# Patient Record
Sex: Female | Born: 1940 | Race: White | Hispanic: No | Smoking: Former smoker
Health system: Southern US, Community
[De-identification: ages and names within clinical notes are randomized; demographics above are authoritative.]

## PROBLEM LIST (undated history)

## (undated) DIAGNOSIS — I10 Essential (primary) hypertension: Secondary | ICD-10-CM

## (undated) DIAGNOSIS — C801 Malignant (primary) neoplasm, unspecified: Secondary | ICD-10-CM

## (undated) HISTORY — PX: OTHER SURGICAL HISTORY: SHX169

## (undated) HISTORY — PX: APPENDECTOMY: SHX54

---

## 2004-02-04 DIAGNOSIS — C801 Malignant (primary) neoplasm, unspecified: Secondary | ICD-10-CM

## 2004-02-04 HISTORY — DX: Malignant (primary) neoplasm, unspecified: C80.1

## 2004-02-04 HISTORY — PX: BREAST LUMPECTOMY: SHX2

## 2005-02-03 HISTORY — PX: CHOLECYSTECTOMY: SHX55

## 2005-05-06 ENCOUNTER — Encounter: Admission: RE | Admit: 2005-05-06 | Discharge: 2005-05-06 | Payer: Self-pay | Admitting: General Surgery

## 2005-05-12 ENCOUNTER — Ambulatory Visit (HOSPITAL_COMMUNITY): Admission: RE | Admit: 2005-05-12 | Discharge: 2005-05-12 | Payer: Self-pay | Admitting: General Surgery

## 2005-12-16 ENCOUNTER — Ambulatory Visit: Admission: RE | Admit: 2005-12-16 | Discharge: 2006-03-16 | Payer: Self-pay | Admitting: Radiation Oncology

## 2010-02-24 ENCOUNTER — Encounter: Payer: Self-pay | Admitting: Hematology and Oncology

## 2010-02-24 ENCOUNTER — Encounter: Payer: Self-pay | Admitting: General Surgery

## 2011-09-23 ENCOUNTER — Encounter: Payer: Self-pay | Admitting: Hematology and Oncology

## 2011-09-23 DIAGNOSIS — C50919 Malignant neoplasm of unspecified site of unspecified female breast: Secondary | ICD-10-CM

## 2012-12-21 ENCOUNTER — Encounter (INDEPENDENT_AMBULATORY_CARE_PROVIDER_SITE_OTHER): Payer: Self-pay | Admitting: *Deleted

## 2012-12-24 ENCOUNTER — Telehealth (INDEPENDENT_AMBULATORY_CARE_PROVIDER_SITE_OTHER): Payer: Self-pay | Admitting: *Deleted

## 2012-12-24 ENCOUNTER — Encounter (INDEPENDENT_AMBULATORY_CARE_PROVIDER_SITE_OTHER): Payer: Self-pay | Admitting: *Deleted

## 2012-12-24 ENCOUNTER — Other Ambulatory Visit (INDEPENDENT_AMBULATORY_CARE_PROVIDER_SITE_OTHER): Payer: Self-pay | Admitting: *Deleted

## 2012-12-24 DIAGNOSIS — Z1211 Encounter for screening for malignant neoplasm of colon: Secondary | ICD-10-CM

## 2012-12-24 NOTE — Telephone Encounter (Signed)
Patient needs movi prep 

## 2012-12-27 MED ORDER — PEG-KCL-NACL-NASULF-NA ASC-C 100 G PO SOLR: 1.0000 | Freq: Once | ORAL | Status: DC

## 2013-03-01 ENCOUNTER — Other Ambulatory Visit: Payer: Self-pay | Admitting: Otolaryngology

## 2013-03-09 ENCOUNTER — Encounter (HOSPITAL_COMMUNITY): Admission: RE | Payer: Self-pay | Source: Ambulatory Visit

## 2013-03-09 ENCOUNTER — Ambulatory Visit (HOSPITAL_COMMUNITY): Admission: RE | Admit: 2013-03-09 | Payer: Medicare Other | Source: Ambulatory Visit | Admitting: Internal Medicine

## 2013-03-09 SURGERY — COLONOSCOPY
Anesthesia: Moderate Sedation

## 2014-12-20 ENCOUNTER — Encounter (INDEPENDENT_AMBULATORY_CARE_PROVIDER_SITE_OTHER): Payer: Self-pay | Admitting: *Deleted

## 2015-02-20 ENCOUNTER — Encounter (INDEPENDENT_AMBULATORY_CARE_PROVIDER_SITE_OTHER): Payer: Self-pay | Admitting: *Deleted

## 2015-02-20 ENCOUNTER — Other Ambulatory Visit (INDEPENDENT_AMBULATORY_CARE_PROVIDER_SITE_OTHER): Payer: Self-pay | Admitting: *Deleted

## 2015-02-20 DIAGNOSIS — Z1211 Encounter for screening for malignant neoplasm of colon: Secondary | ICD-10-CM

## 2015-03-22 DIAGNOSIS — Z8041 Family history of malignant neoplasm of ovary: Secondary | ICD-10-CM | POA: Diagnosis not present

## 2015-03-22 DIAGNOSIS — Z803 Family history of malignant neoplasm of breast: Secondary | ICD-10-CM | POA: Diagnosis not present

## 2015-03-22 DIAGNOSIS — Z01419 Encounter for gynecological examination (general) (routine) without abnormal findings: Secondary | ICD-10-CM | POA: Diagnosis not present

## 2015-03-22 DIAGNOSIS — Z853 Personal history of malignant neoplasm of breast: Secondary | ICD-10-CM | POA: Diagnosis not present

## 2015-03-22 DIAGNOSIS — Z6826 Body mass index (BMI) 26.0-26.9, adult: Secondary | ICD-10-CM | POA: Diagnosis not present

## 2015-03-22 DIAGNOSIS — Z801 Family history of malignant neoplasm of trachea, bronchus and lung: Secondary | ICD-10-CM | POA: Diagnosis not present

## 2015-03-27 DIAGNOSIS — H40023 Open angle with borderline findings, high risk, bilateral: Secondary | ICD-10-CM | POA: Diagnosis not present

## 2015-03-27 DIAGNOSIS — H04123 Dry eye syndrome of bilateral lacrimal glands: Secondary | ICD-10-CM | POA: Diagnosis not present

## 2015-03-27 DIAGNOSIS — H16423 Pannus (corneal), bilateral: Secondary | ICD-10-CM | POA: Diagnosis not present

## 2015-03-27 DIAGNOSIS — H01003 Unspecified blepharitis right eye, unspecified eyelid: Secondary | ICD-10-CM | POA: Diagnosis not present

## 2015-04-10 ENCOUNTER — Other Ambulatory Visit (INDEPENDENT_AMBULATORY_CARE_PROVIDER_SITE_OTHER): Payer: Self-pay | Admitting: *Deleted

## 2015-04-10 ENCOUNTER — Encounter (INDEPENDENT_AMBULATORY_CARE_PROVIDER_SITE_OTHER): Payer: Self-pay | Admitting: *Deleted

## 2015-04-10 DIAGNOSIS — Z1211 Encounter for screening for malignant neoplasm of colon: Secondary | ICD-10-CM

## 2015-04-10 MED ORDER — PEG-KCL-NACL-NASULF-NA ASC-C 100 G PO SOLR
1.0000 | Freq: Once | ORAL | Status: DC
Start: 2015-04-10 — End: 2015-05-23

## 2015-04-10 NOTE — Telephone Encounter (Signed)
Patient needs movi prep 

## 2015-04-24 DIAGNOSIS — J329 Chronic sinusitis, unspecified: Secondary | ICD-10-CM | POA: Diagnosis not present

## 2015-04-24 DIAGNOSIS — Z87891 Personal history of nicotine dependence: Secondary | ICD-10-CM | POA: Diagnosis not present

## 2015-04-24 DIAGNOSIS — R51 Headache: Secondary | ICD-10-CM | POA: Diagnosis not present

## 2015-04-24 DIAGNOSIS — R0982 Postnasal drip: Secondary | ICD-10-CM | POA: Diagnosis not present

## 2015-04-24 DIAGNOSIS — Z299 Encounter for prophylactic measures, unspecified: Secondary | ICD-10-CM | POA: Diagnosis not present

## 2015-04-24 DIAGNOSIS — R05 Cough: Secondary | ICD-10-CM | POA: Diagnosis not present

## 2015-05-02 ENCOUNTER — Telehealth (INDEPENDENT_AMBULATORY_CARE_PROVIDER_SITE_OTHER): Payer: Self-pay | Admitting: *Deleted

## 2015-05-02 NOTE — Telephone Encounter (Signed)
agree

## 2015-05-02 NOTE — Telephone Encounter (Signed)
Referring MD/PCP: vyas   Procedure: tcs  Reason/Indication:  screening  Has patient had this procedure before?  Yes, 10 yrs ago   If so, when, by whom and where?    Is there a family history of colon cancer?  no  Who?  What age when diagnosed?    Is patient diabetic?   no      Does patient have prosthetic heart valve or mechanical valve?  no  Do you have a pacemaker?  no  Has patient ever had endocarditis? no  Has patient had joint replacement within last 12 months?  nono  Does patient tend to be constipated or take laxatives? no  Does patient have a history of alcohol/drug use?  no  Is patient on Coumadin, Plavix and/or Aspirin? no  Medications: paxil 20 mg daily, lisinopril 10 mg daily   Allergies: nkda  Medication Adjustment:   Procedure date & time: 05/23/15 at 730

## 2015-05-23 ENCOUNTER — Encounter (HOSPITAL_COMMUNITY)
Admission: RE | Disposition: A | Discharge status: Home / Self Care | Payer: Self-pay | Source: Ambulatory Visit | Attending: Internal Medicine

## 2015-05-23 ENCOUNTER — Ambulatory Visit (HOSPITAL_COMMUNITY)
Admission: RE | Admit: 2015-05-23 | Discharge: 2015-05-23 | Disposition: A | Discharge status: Home / Self Care | Payer: Medicare Other | Source: Ambulatory Visit | Attending: Internal Medicine | Admitting: Internal Medicine

## 2015-05-23 ENCOUNTER — Encounter (HOSPITAL_COMMUNITY): Payer: Self-pay | Admitting: *Deleted

## 2015-05-23 DIAGNOSIS — I10 Essential (primary) hypertension: Secondary | ICD-10-CM | POA: Insufficient documentation

## 2015-05-23 DIAGNOSIS — Z1211 Encounter for screening for malignant neoplasm of colon: Secondary | ICD-10-CM | POA: Diagnosis not present

## 2015-05-23 DIAGNOSIS — K644 Residual hemorrhoidal skin tags: Secondary | ICD-10-CM | POA: Diagnosis not present

## 2015-05-23 DIAGNOSIS — Z853 Personal history of malignant neoplasm of breast: Secondary | ICD-10-CM | POA: Diagnosis not present

## 2015-05-23 DIAGNOSIS — Z79899 Other long term (current) drug therapy: Secondary | ICD-10-CM | POA: Diagnosis not present

## 2015-05-23 DIAGNOSIS — Z8049 Family history of malignant neoplasm of other genital organs: Secondary | ICD-10-CM | POA: Insufficient documentation

## 2015-05-23 DIAGNOSIS — K5730 Diverticulosis of large intestine without perforation or abscess without bleeding: Secondary | ICD-10-CM | POA: Diagnosis not present

## 2015-05-23 DIAGNOSIS — Z87891 Personal history of nicotine dependence: Secondary | ICD-10-CM | POA: Insufficient documentation

## 2015-05-23 DIAGNOSIS — D122 Benign neoplasm of ascending colon: Secondary | ICD-10-CM | POA: Insufficient documentation

## 2015-05-23 HISTORY — PX: COLONOSCOPY: SHX5424

## 2015-05-23 HISTORY — DX: Malignant (primary) neoplasm, unspecified: C80.1

## 2015-05-23 HISTORY — DX: Essential (primary) hypertension: I10

## 2015-05-23 SURGERY — COLONOSCOPY
Anesthesia: Moderate Sedation

## 2015-05-23 MED ORDER — MIDAZOLAM HCL 5 MG/5ML IJ SOLN
INTRAMUSCULAR | Status: AC
  Filled 2015-05-23: qty 10

## 2015-05-23 MED ORDER — MEPERIDINE HCL 50 MG/ML IJ SOLN
INTRAMUSCULAR | Status: AC
  Filled 2015-05-23: qty 1

## 2015-05-23 MED ORDER — MIDAZOLAM HCL 5 MG/5ML IJ SOLN
INTRAMUSCULAR | Status: DC | PRN
  Administered 2015-05-23: 1 mg via INTRAVENOUS
  Administered 2015-05-23 (×2): 2 mg via INTRAVENOUS

## 2015-05-23 MED ORDER — MEPERIDINE HCL 50 MG/ML IJ SOLN
INTRAMUSCULAR | Status: DC | PRN
  Administered 2015-05-23 (×2): 25 mg via INTRAVENOUS

## 2015-05-23 MED ORDER — SODIUM CHLORIDE 0.9 % IV SOLN
INTRAVENOUS | Status: DC
  Administered 2015-05-23: 1000 mL via INTRAVENOUS

## 2015-05-23 NOTE — Op Note (Signed)
[facility] Patient Name: Anna Silva Procedure Date: 05/23/2015 7:29 AM MRN: [id number] Date of Birth: 24-Dec-1940 Attending MD: Hildred Laser , MD CSN: [id number] Age: 75 Admit Type: Outpatient Procedure:                Colonoscopy Indications:              Screening for colorectal malignant neoplasm Providers:                Hildred Laser, MD, Lurline Del, RN, Georgeann Oppenheim,                            Technician Referring MD:             Glenda Chroman Medicines:                Meperidine 50 mg IV, Midazolam 5 mg IV Complications:            No immediate complications. Estimated Blood Loss:     Estimated blood loss was minimal. Procedure:                Pre-Anesthesia Assessment:                           - Prior to the procedure, a History and Physical                            was performed, and patient medications and                            allergies were reviewed. The patient's tolerance of                            previous anesthesia was also reviewed. The risks                            and benefits of the procedure and the sedation                            options and risks were discussed with the patient.                            All questions were answered, and informed consent                            was obtained. Prior Anticoagulants: The patient has                            taken no previous anticoagulant or antiplatelet                            agents. ASA Grade Assessment: I - A normal, healthy                            patient. After reviewing the risks and benefits,  the patient was deemed in satisfactory condition to                            undergo the procedure.                           After obtaining informed consent, the colonoscope                            was passed under direct vision. Throughout the                            procedure, the patient's blood pressure, pulse, and    oxygen saturations were monitored continuously. The                            [id number]) was introduced through the                            anus and advanced to the the cecum, identified by                            appendiceal orifice and ileocecal valve. The                            colonoscopy was performed without difficulty. The                            patient tolerated the procedure well. The quality                            of the bowel preparation was adequate. The                            ileocecal valve, appendiceal orifice, and rectum                            were photographed. Scope In: 7:43:22 AM Scope Out: 8:04:14 AM Scope Withdrawal Time: 0 hours 10 minutes 50 seconds  Total Procedure Duration: 0 hours 20 minutes 52 seconds  Findings:      A 6 mm polyp was found in the ascending colon. The polyp was sessile.       The polyp was removed with a cold snare. Resection and retrieval were       complete. For hemostasis, one hemostatic clip was successfully placed       (MR conditional). There was no bleeding at the end of the procedure.      A few small-mouthed diverticula were found in the sigmoid colon.      External hemorrhoids were found during retroflexion. The hemorrhoids       were small. Impression:               - One 6 mm polyp in the ascending colon, removed                            with  a cold snare. Resected and retrieved. Clip (MR                            conditional) was placed.                           - Diverticulosis in the sigmoid colon.                           - External hemorrhoids. Moderate Sedation:      Moderate (conscious) sedation was administered by the endoscopy nurse       and supervised by the endoscopist. The following parameters were       monitored: oxygen saturation, heart rate, blood pressure, CO2       capnography and response to care. Total physician intraservice time was       27 minutes. Recommendation:            - Patient has a contact number available for                            emergencies. The signs and symptoms of potential                            delayed complications were discussed with the                            patient. Return to normal activities tomorrow.                            Written discharge instructions were provided to the                            patient.                           - Resume previous diet and high fiber diet today.                           - Continue present medications.                           - No aspirin, ibuprofen, naproxen, or other                            non-steroidal anti-inflammatory drugs for 3 days                            after polyp removal.                           - Repeat colonoscopy date to be determined after                            pending pathology results are reviewed for  surveillance based on pathology results. Procedure Code(s):        --- Professional ---                           [phone], Colonoscopy, flexible; with removal of                            tumor(s), polyp(s), or other lesion(s) by snare                            technique                           99152, Moderate sedation services provided by the                            same physician or other qualified health care                            professional performing the diagnostic or                            therapeutic service that the sedation supports,                            requiring the presence of an independent trained                            observer to assist in the monitoring of the                            patient's level of consciousness and physiological                            status; initial 15 minutes of intraservice time,                            patient age 4 years or older                           [phone], Moderate sedation services; each additional                            15  minutes intraservice time Diagnosis Code(s):        --- Professional ---                           Z12.11, Encounter for screening for malignant                            neoplasm of colon                           D12.2, Benign neoplasm of ascending colon  K64.4, Residual hemorrhoidal skin tags                           K57.30, Diverticulosis of large intestine without                            perforation or abscess without bleeding CPT copyright 2016 American Medical Association. All rights reserved. The codes documented in this report are preliminary and upon coder review may  be revised to meet current compliance requirements. Hildred Laser, MD Hildred Laser, MD 05/23/2015 8:13:37 AM This report has been signed electronically. Number of Addenda: 0

## 2015-05-23 NOTE — H&P (Signed)
Anna Silva is an 75 y.o. female.   Chief Complaint: Patient is here for colonoscopy. HPI: 75 year old Caucasian female who is here for screening colonoscopy. She denies abdominal pain change in bowel habits or rectal bleeding. Last exam was 10 years ago in [facility] and was normal. Family history is negative for CRC. Mother died of uterine cancer at age 44. Patient states she had genetic testing and the test was normal.  Past Medical History  Diagnosis Date  . Hypertension   . 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 Breast lumpectomy  2006  . Cholecystectomy  2007  . Appendectomy    . C-sections      x3  . Caroid surgery Right     History reviewed. No pertinent family history. Social History:  reports that she has quit smoking. Her smoking use included Cigarettes. She has a 10 pack-year smoking history. She quit smokeless tobacco use about 30 years ago. She reports that she drinks about 7.2 oz of alcohol per week. She reports that she does not use illicit drugs.  Allergies: Not on File  Medications Prior to Admission  Medication Sig Dispense Refill  . lisinopril (PRINIVIL,ZESTRIL) 10 MG tablet Take 10 mg by mouth daily.    Marland Kitchen PARoxetine (PAXIL) 20 MG tablet Take 20 mg by mouth daily.    . peg 3350 powder (MOVIPREP) 100 g SOLR Take 1 kit (200 g total) by mouth once. 1 kit 0    No results found for this or any previous visit (from the past 48 hour(s)). No results found.  ROS  Blood pressure 187/83, pulse 53, temperature 97.6 F (36.4 C), temperature source Oral, resp. rate 9, height 5' 4"  (1.626 m), weight 150 lb (68.04 kg), SpO2 100 %. Physical Exam  Constitutional: She appears well-developed and well-nourished.  HENT:  Mouth/Throat: Oropharynx is clear and moist.  Eyes: Conjunctivae are normal.  Neck: No thyromegaly present.  Cardiovascular: Normal rate, regular rhythm and normal heart sounds.   No  murmur heard. Respiratory: Effort normal and breath sounds normal.  GI: Soft. She exhibits no distension and no mass. There is no tenderness.  Musculoskeletal: She exhibits no edema.  Lymphadenopathy:    She has no cervical adenopathy.  Neurological: She is alert.  Skin: Skin is warm and dry.     Assessment/Plan Average risk screening colonoscopy.  Rogene Houston, MD 05/23/2015, 7:30 AM

## 2015-05-23 NOTE — Discharge Instructions (Signed)
No aspirin or NSAIDs for 3 days. Resume usual medications and high fiber diet. No driving for 24 hours. Physician will call with biopsy results.   Colonoscopy, Care After These instructions give you information on caring for yourself after your procedure. Your doctor may also give you more specific instructions. Call your doctor if you have any problems or questions after your procedure. HOME CARE  Do not drive for 24 hours.  Do not sign important papers or use machinery for 24 hours.  You may shower.  You may go back to your usual activities, but go slower for the first 24 hours.  Take rest breaks often during the first 24 hours.  Walk around or use warm packs on your belly (abdomen) if you have belly cramping or gas.  Drink enough fluids to keep your pee (urine) clear or pale yellow.  Resume your normal diet. Avoid heavy or fried foods.  Avoid drinking alcohol for 24 hours or as told by your doctor.  Only take medicines as told by your doctor. If a tissue sample (biopsy) was taken during the procedure:   Do not take aspirin or blood thinners for 7 days, or as told by your doctor.  Do not drink alcohol for 7 days, or as told by your doctor.  Eat soft foods for the first 24 hours. GET HELP IF: You still have a small amount of blood in your poop (stool) 2-3 days after the procedure. GET HELP RIGHT AWAY IF:  You have more than a small amount of blood in your poop.  You see clumps of tissue (blood clots) in your poop.  Your belly is puffy (swollen).  You feel sick to your stomach (nauseous) or throw up (vomit).  You have a fever.  You have belly pain that gets worse and medicine does not help. MAKE SURE YOU:  Understand these instructions.  Will watch your condition.  Will get help right away if you are not doing well or get worse.   This information is not intended to replace advice given to you by your health care provider. Make sure you discuss any questions  you have with your health care provider.   Document Released: 02/22/2010 Document Revised: 01/25/2013 Document Reviewed: 09/27/2012 Elsevier Interactive Patient Education [employer].   Diverticulosis Diverticulosis is the condition that develops when small pouches (diverticula) form in the wall of your colon. Your colon, or large intestine, is where water is absorbed and stool is formed. The pouches form when the inside layer of your colon pushes through weak spots in the outer layers of your colon. CAUSES  No one knows exactly what causes diverticulosis. RISK FACTORS  Being older than 3. Your risk for this condition increases with age. Diverticulosis is rare in people younger than 40 years. By age 58, almost everyone has it.  Eating a low-fiber diet.  Being frequently constipated.  Being overweight.  Not getting enough exercise.  Smoking.  Taking over-the-counter pain medicines, like aspirin and ibuprofen. SYMPTOMS  Most people with diverticulosis do not have symptoms. DIAGNOSIS  Because diverticulosis often has no symptoms, health care providers often discover the condition during an exam for other colon problems. In many cases, a health care provider will diagnose diverticulosis while using a flexible scope to examine the colon (colonoscopy). TREATMENT  If you have never developed an infection related to diverticulosis, you may not need treatment. If you have had an infection before, treatment may include:  Eating more fruits, vegetables, and  grains.  Taking a fiber supplement.  Taking a live bacteria supplement (probiotic).  Taking medicine to relax your colon. HOME CARE INSTRUCTIONS   Drink at least 6-8 glasses of water each day to prevent constipation.  Try not to strain when you have a bowel movement.  Keep all follow-up appointments. If you have had an infection before:  Increase the fiber in your diet as directed by your health care provider or  dietitian.  Take a dietary fiber supplement if your health care provider approves.  Only take medicines as directed by your health care provider. SEEK MEDICAL CARE IF:   You have abdominal pain.  You have bloating.  You have cramps.  You have not gone to the bathroom in 3 days. SEEK IMMEDIATE MEDICAL CARE IF:   Your pain gets worse.  Yourbloating becomes very bad.  You have a fever or chills, and your symptoms suddenly get worse.  You begin vomiting.  You have bowel movements that are bloody or black. MAKE SURE YOU:  Understand these instructions.  Will watch your condition.  Will get help right away if you are not doing well or get worse.   This information is not intended to replace advice given to you by your health care provider. Make sure you discuss any questions you have with your health care provider.   Document Released: 10/18/2003 Document Revised: 01/25/2013 Document Reviewed: 12/15/2012 Elsevier Interactive Patient Education 2016 [employer].   Hemorrhoids Hemorrhoids are puffy (swollen) veins around the rectum or anus. Hemorrhoids can cause pain, itching, bleeding, or irritation. HOME CARE  Eat foods with fiber, such as whole grains, beans, nuts, fruits, and vegetables. Ask your doctor about taking products with added fiber in them (fibersupplements).  Drink enough fluid to keep your pee (urine) clear or pale yellow.  Exercise often.  Go to the bathroom when you have the urge to poop. Do not wait.  Avoid straining to poop (bowel movement).  Keep the butt area dry and clean. Use wet toilet paper or moist paper towels.  Medicated creams and medicine inserted into the anus (anal suppository) may be used or applied as told.  Only take medicine as told by your doctor.  Take a warm water bath (sitz bath) for 15-20 minutes to ease pain. Do this 3-4 times a day.  Place ice packs on the area if it is tender or puffy. Use the ice packs between the  warm water baths.  Put ice in a plastic bag.  Place a towel between your skin and the bag.  Leave the ice on for 15-20 minutes, 03-04 times a day.  Do not use a donut-shaped pillow or sit on the toilet for a long time. GET HELP RIGHT AWAY IF:   You have more pain that is not controlled by treatment or medicine.  You have bleeding that will not stop.  You have trouble or are unable to poop (bowel movement).  You have pain or puffiness outside the area of the hemorrhoids. MAKE SURE YOU:   Understand these instructions.  Will watch your condition.  Will get help right away if you are not doing well or get worse.   This information is not intended to replace advice given to you by your health care provider. Make sure you discuss any questions you have with your health care provider.   Document Released: 10/30/2007 Document Revised: 01/07/2012 Document Reviewed: 12/02/2011 Elsevier Interactive Patient Education 2016 Elsevier Inc.   Colon Polyps Polyps are lumps  of extra tissue growing inside the body. Polyps can grow in the large intestine (colon). Most colon polyps are noncancerous (benign). However, some colon polyps can become cancerous over time. Polyps that are larger than a pea may be harmful. To be safe, caregivers remove and test all polyps. CAUSES  Polyps form when mutations in the genes cause your cells to grow and divide even though no more tissue is needed. RISK FACTORS There are a number of risk factors that can increase your chances of getting colon polyps. They include:  Being older than 50 years.  Family history of colon polyps or colon cancer.  Long-term colon diseases, such as colitis or Crohn disease.  Being overweight.  Smoking.  Being inactive.  Drinking too much alcohol. SYMPTOMS  Most small polyps do not cause symptoms. If symptoms are present, they may include:  Blood in the stool. The stool may look dark red or black.  Constipation or  diarrhea that lasts longer than 1 week. DIAGNOSIS People often do not know they have polyps until their caregiver finds them during a regular checkup. Your caregiver can use 4 tests to check for polyps:  Digital rectal exam. The caregiver wears gloves and feels inside the rectum. This test would find polyps only in the rectum.  Barium enema. The caregiver puts a liquid called barium into your rectum before taking X-rays of your colon. Barium makes your colon look white. Polyps are dark, so they are easy to see in the X-ray pictures.  Sigmoidoscopy. A thin, flexible tube (sigmoidoscope) is placed into your rectum. The sigmoidoscope has a light and tiny camera in it. The caregiver uses the sigmoidoscope to look at the last third of your colon.  Colonoscopy. This test is like sigmoidoscopy, but the caregiver looks at the entire colon. This is the most common method for finding and removing polyps. TREATMENT  Any polyps will be removed during a sigmoidoscopy or colonoscopy. The polyps are then tested for cancer. PREVENTION  To help lower your risk of getting more colon polyps:  Eat plenty of fruits and vegetables. Avoid eating fatty foods.  Do not smoke.  Avoid drinking alcohol.  Exercise every day.  Lose weight if recommended by your caregiver.  Eat plenty of calcium and folate. Foods that are rich in calcium include milk, cheese, and broccoli. Foods that are rich in folate include chickpeas, kidney beans, and spinach. HOME CARE INSTRUCTIONS Keep all follow-up appointments as directed by your caregiver. You may need periodic exams to check for polyps. SEEK MEDICAL CARE IF: You notice bleeding during a bowel movement.   This information is not intended to replace advice given to you by your health care provider. Make sure you discuss any questions you have with your health care provider.   Document Released: 10/17/2003 Document Revised: 02/10/2014 Document Reviewed: 04/01/2011 Elsevier  Interactive Patient Education 2016 Elsevier Inc.  Moderate Conscious Sedation, Adult, Care After Refer to this sheet in the next few weeks. These instructions provide you with information on caring for yourself after your procedure. Your health care provider may also give you more specific instructions. Your treatment has been planned according to current medical practices, but problems sometimes occur. Call your health care provider if you have any problems or questions after your procedure. WHAT TO EXPECT AFTER THE PROCEDURE  After your procedure:  You may feel sleepy, clumsy, and have poor balance for several hours.  Vomiting may occur if you eat too soon after the procedure. HOME CARE  INSTRUCTIONS  Do not participate in any activities where you could become injured for at least 24 hours. Do not:  Drive.  Swim.  Ride a bicycle.  Operate heavy machinery.  Cook.  Use power tools.  Climb ladders.  Work from a high place.  Do not make important decisions or sign legal documents until you are improved.  If you vomit, drink water, juice, or soup when you can drink without vomiting. Make sure you have little or no nausea before eating solid foods.  Only take over-the-counter or prescription medicines for pain, discomfort, or fever as directed by your health care provider.  Make sure you and your family fully understand everything about the medicines given to you, including what side effects may occur.  You should not drink alcohol, take sleeping pills, or take medicines that cause drowsiness for at least 24 hours.  If you smoke, do not smoke without supervision.  If you are feeling better, you may resume normal activities 24 hours after you were sedated.  Keep all appointments with your health care provider. SEEK MEDICAL CARE IF:  Your skin is pale or bluish in color.  You continue to feel nauseous or vomit.  Your pain is getting worse and is not helped by  medicine.  You have bleeding or swelling.  You are still sleepy or feeling clumsy after 24 hours. SEEK IMMEDIATE MEDICAL CARE IF:  You develop a rash.  You have difficulty breathing.  You develop any type of allergic problem.  You have a fever. MAKE SURE YOU:  Understand these instructions.  Will watch your condition.  Will get help right away if you are not doing well or get worse.   This information is not intended to replace advice given to you by your health care provider. Make sure you discuss any questions you have with your health care provider.   Document Released: 11/10/2012 Document Revised: 02/10/2014 Document Reviewed: 11/10/2012 Elsevier Interactive Patient Education [employer].

## 2015-05-28 ENCOUNTER — Encounter (HOSPITAL_COMMUNITY): Payer: Self-pay | Admitting: Internal Medicine

## 2015-06-18 DIAGNOSIS — I10 Essential (primary) hypertension: Secondary | ICD-10-CM | POA: Diagnosis not present

## 2015-06-18 DIAGNOSIS — Z299 Encounter for prophylactic measures, unspecified: Secondary | ICD-10-CM | POA: Diagnosis not present

## 2015-06-27 DIAGNOSIS — H40023 Open angle with borderline findings, high risk, bilateral: Secondary | ICD-10-CM | POA: Diagnosis not present

## 2015-06-27 DIAGNOSIS — H01003 Unspecified blepharitis right eye, unspecified eyelid: Secondary | ICD-10-CM | POA: Diagnosis not present

## 2015-11-02 DIAGNOSIS — I10 Essential (primary) hypertension: Secondary | ICD-10-CM | POA: Diagnosis not present

## 2015-11-02 DIAGNOSIS — J069 Acute upper respiratory infection, unspecified: Secondary | ICD-10-CM | POA: Diagnosis not present

## 2015-11-09 DIAGNOSIS — Z713 Dietary counseling and surveillance: Secondary | ICD-10-CM | POA: Diagnosis not present

## 2015-11-09 DIAGNOSIS — J329 Chronic sinusitis, unspecified: Secondary | ICD-10-CM | POA: Diagnosis not present

## 2015-11-09 DIAGNOSIS — I10 Essential (primary) hypertension: Secondary | ICD-10-CM | POA: Diagnosis not present

## 2015-11-09 DIAGNOSIS — Z299 Encounter for prophylactic measures, unspecified: Secondary | ICD-10-CM | POA: Diagnosis not present

## 2015-11-09 DIAGNOSIS — Z6824 Body mass index (BMI) 24.0-24.9, adult: Secondary | ICD-10-CM | POA: Diagnosis not present

## 2015-11-10 ENCOUNTER — Emergency Department (HOSPITAL_COMMUNITY)
Admission: EM | Admit: 2015-11-10 | Discharge: 2015-11-11 | Disposition: A | Discharge status: Home / Self Care | Payer: Medicare Other | Attending: Emergency Medicine | Admitting: Emergency Medicine

## 2015-11-10 ENCOUNTER — Encounter (HOSPITAL_COMMUNITY): Payer: Self-pay | Admitting: Emergency Medicine

## 2015-11-10 DIAGNOSIS — R030 Elevated blood-pressure reading, without diagnosis of hypertension: Secondary | ICD-10-CM | POA: Diagnosis not present

## 2015-11-10 DIAGNOSIS — T380X5A Adverse effect of glucocorticoids and synthetic analogues, initial encounter: Secondary | ICD-10-CM | POA: Diagnosis not present

## 2015-11-10 DIAGNOSIS — T50905A Adverse effect of unspecified drugs, medicaments and biological substances, initial encounter: Secondary | ICD-10-CM

## 2015-11-10 DIAGNOSIS — I10 Essential (primary) hypertension: Secondary | ICD-10-CM | POA: Diagnosis not present

## 2015-11-10 DIAGNOSIS — R079 Chest pain, unspecified: Secondary | ICD-10-CM | POA: Diagnosis not present

## 2015-11-10 DIAGNOSIS — R072 Precordial pain: Secondary | ICD-10-CM | POA: Insufficient documentation

## 2015-11-10 DIAGNOSIS — Z87891 Personal history of nicotine dependence: Secondary | ICD-10-CM | POA: Diagnosis not present

## 2015-11-10 DIAGNOSIS — Z853 Personal history of malignant neoplasm of breast: Secondary | ICD-10-CM | POA: Diagnosis not present

## 2015-11-10 NOTE — ED Triage Notes (Signed)
Per EMS:  Pt has recent ear/sinus infection which "spiked again" today.  Pt was seen at PCP and started on prednisone with a steroid injection as well.  Pt began to experience right sided CP with radiation to her back.  Denies any other associated symptoms.  EMS sts EKG was NSR, but beats began to widen and pt had several  "dropped beats".  Pt axo, NAD, pain has resolved.

## 2015-11-11 ENCOUNTER — Emergency Department (HOSPITAL_COMMUNITY): Payer: Medicare Other

## 2015-11-11 ENCOUNTER — Encounter (HOSPITAL_COMMUNITY): Payer: Self-pay | Admitting: Emergency Medicine

## 2015-11-11 DIAGNOSIS — R072 Precordial pain: Secondary | ICD-10-CM | POA: Diagnosis not present

## 2015-11-11 DIAGNOSIS — R079 Chest pain, unspecified: Secondary | ICD-10-CM | POA: Diagnosis not present

## 2015-11-11 LAB — CBC WITH DIFFERENTIAL/PLATELET
BASOS PCT: 0 %
Basophils Absolute: 0 10*3/uL (ref 0.0–0.1)
EOS ABS: 0 10*3/uL (ref 0.0–0.7)
Eosinophils Relative: 0 %
HCT: 56.4 % — ABNORMAL HIGH (ref 36.0–46.0)
HEMOGLOBIN: 19 g/dL — AB (ref 12.0–15.0)
LYMPHS ABS: 1.1 10*3/uL (ref 0.7–4.0)
Lymphocytes Relative: 12 %
MCH: 30.9 pg (ref 26.0–34.0)
MCHC: 33.7 g/dL (ref 30.0–36.0)
MCV: 91.9 fL (ref 78.0–100.0)
MONO ABS: 0.4 10*3/uL (ref 0.1–1.0)
MONOS PCT: 5 %
NEUTROS PCT: 83 %
Neutro Abs: 7.3 10*3/uL (ref 1.7–7.7)
Platelets: 237 10*3/uL (ref 150–400)
RBC: 6.14 MIL/uL — ABNORMAL HIGH (ref 3.87–5.11)
RDW: 13.9 % (ref 11.5–15.5)
WBC: 8.9 10*3/uL (ref 4.0–10.5)

## 2015-11-11 LAB — I-STAT CHEM 8, ED
BUN: 20 mg/dL (ref 6–20)
CALCIUM ION: 0.97 mmol/L — AB (ref 1.15–1.40)
CREATININE: 0.6 mg/dL (ref 0.44–1.00)
Chloride: 106 mmol/L (ref 101–111)
Glucose, Bld: 115 mg/dL — ABNORMAL HIGH (ref 65–99)
HEMATOCRIT: 57 % — AB (ref 36.0–46.0)
HEMOGLOBIN: 19.4 g/dL — AB (ref 12.0–15.0)
Potassium: 4.3 mmol/L (ref 3.5–5.1)
SODIUM: 140 mmol/L (ref 135–145)
TCO2: 21 mmol/L (ref 0–100)

## 2015-11-11 LAB — I-STAT TROPONIN, ED
Troponin i, poc: 0 ng/mL (ref 0.00–0.08)
Troponin i, poc: 0 ng/mL (ref 0.00–0.08)

## 2015-11-11 MED ORDER — GI COCKTAIL ~~LOC~~
30.0000 mL | Freq: Once | ORAL | Status: AC
  Administered 2015-11-11: 30 mL via ORAL
  Filled 2015-11-11: qty 30

## 2015-11-11 NOTE — ED Notes (Signed)
Pt transported to xray 

## 2015-11-11 NOTE — ED Provider Notes (Signed)
[facility] DEPT Provider Note   CSN: [id number] Arrival date & time: 11/10/15  2340  By signing my name below, I, Hansel Feinstein, attest that this documentation has been prepared under the direction and in the presence of Clancy Leiner, MD. Electronically Signed: Hansel Feinstein, ED Scribe. 11/11/15. 12:21 AM.     History   Chief Complaint Chief Complaint  Patient presents with  . Chest Pain    HPI Anna Silva is a 75 y.o. female with h/o HTN who presents to the Emergency Department complaining of a moderate episode of CP that occurred last night, and lasted approximately one hour. Pt states that she had a glass of wine tonight and subsequently began to feel substernal CP that radiated to her back, which lasted approximately an hour. She also notes that she had ear pain during this episode of pain. She states her symptoms felt similar to indigestion. She denies any pain currently. Pt states she has been taking amoxicillin and IM prednisone for the last week for a congestion and URI. She also reports she has been taking Mucinex for her URI symptoms. Pt states her URI symptoms have not worsened this evening. She denies SOB.   The history is provided by the patient. No language interpreter was used.  Chest Pain   This is a new problem. The current episode started 1 to 2 hours ago. The problem occurs constantly. The problem has been resolved. Associated with: alcohol consumption  The pain is present in the substernal region. The pain is moderate. The quality of the pain is described as brief. The pain radiates to the upper back. Duration of episode(s) is 1 hour. Pertinent negatives include no shortness of breath. Risk factors include alcohol intake.  Her past medical history is significant for hypertension.    Past Medical History:  Diagnosis Date  . Cancer [facility]) 2006   Breat Cancer Right  . Hypertension     There are no active problems to display for this patient.   Past Surgical  History:  Procedure Laterality Date  . APPENDECTOMY    . BREAST LUMPECTOMY  2006  . C-sections     x3  . Caroid Surgery Right   . CHOLECYSTECTOMY  2007  . COLONOSCOPY N/A 05/23/2015   Procedure: COLONOSCOPY;  Surgeon: Rogene Houston, MD;  Location: [facility]ITE;  Service: Endoscopy;  Laterality: N/A;  730    OB History    No data available       Home Medications    Prior to Admission medications   Medication Sig Start Date End Date Taking? Authorizing Provider  lisinopril (PRINIVIL,ZESTRIL) 10 MG tablet Take 10 mg by mouth daily.    Historical Provider, MD  PARoxetine (PAXIL) 20 MG tablet Take 20 mg by mouth daily.    Historical Provider, MD    Family History Family History  Problem Relation Age of Onset  . Cancer - Other Mother     Social History Social History  Substance Use Topics  . Smoking status: Former Smoker    Packs/day: 0.50    Years: 20.00    Types: Cigarettes  . Smokeless tobacco: Former Systems developer    Quit date: 02/02/1985  . Alcohol use 7.2 oz/week    12 Glasses of wine per week     Comment: Couple of glasses of wine nightly     Allergies   Review of patient's allergies indicates no known allergies.   Review of Systems Review of Systems  HENT: Positive for  congestion and ear pain.   Respiratory: Negative for shortness of breath.   Cardiovascular: Positive for chest pain (resolved).  All other systems reviewed and are negative.    Physical Exam Updated Vital Signs BP 150/99 (BP Location: Left Arm)   Pulse 94   Temp 98.1 F (36.7 C) (Oral)   Resp 20   Ht 5\' 4"  (1.626 m)   Wt 147 lb (66.7 kg)   SpO2 92%   BMI 25.23 kg/m   Physical Exam  Constitutional: She is oriented to person, place, and time. She appears well-developed and well-nourished.  HENT:  Head: Normocephalic and atraumatic.  Mouth/Throat: Oropharynx is clear and moist. No oropharyngeal exudate.  Eyes: Conjunctivae and EOM are normal. Pupils are equal, round, and reactive to  light.  Moist mucous membranes.   Neck: Normal range of motion. Neck supple. No JVD present. No tracheal deviation present.  No carotid bruits. Trachea midline.   Cardiovascular: Normal rate, regular rhythm and normal heart sounds.  Exam reveals no gallop and no friction rub.   No murmur heard. RRR.   Pulmonary/Chest: Effort normal and breath sounds normal. No stridor. No respiratory distress. She has no wheezes. She has no rales.  Lungs CTA bilaterally.   Abdominal: Soft. She exhibits no distension. There is no rebound and no guarding.  Bowel sounds halfway up into the thoracic cavity. Hyperactive bowel sounds throughout the abdominal cavity.   Musculoskeletal: Normal range of motion.  Intact PTs. Intact DPs. No edema. All compartments soft.   Lymphadenopathy:    She has no cervical adenopathy.  Neurological: She is alert and oriented to person, place, and time. She has normal reflexes.  Skin: Skin is warm and dry.  Psychiatric: She has a normal mood and affect.  Nursing note and vitals reviewed.   ED Treatments / Results   DIAGNOSTIC STUDIES: Oxygen Saturation is 92% on RA, low by my interpretation.    COORDINATION OF CARE: 12:18 AM Discussed treatment plan with pt at bedside which includes lab work, CXR and pt agreed to plan.    Labs (all labs ordered are listed, but only abnormal results are displayed) Labs Reviewed  I-STAT CHEM 8, ED - Abnormal; Notable for the following:       Result Value   Glucose, Bld 115 (*)    Calcium, Ion 0.97 (*)    Hemoglobin 19.4 (*)    HCT 57.0 (*)    All other components within normal limits  CBC WITH DIFFERENTIAL/PLATELET  I-STAT TROPOININ, ED    EKG  EKG Interpretation  Date/Time:  Saturday November 10 2015 23:46:46 EDT Ventricular Rate:  103 PR Interval:    QRS Duration: 88 QT Interval:  343 QTC Calculation: 449 R Axis:   4 Text Interpretation:  Sinus tachycardia RSR' in V1 or V2, probably normal variant Confirmed by  [facility]  MD, Kaine Mcquillen ([id number]) on 11/11/2015 12:04:54 AM       Radiology Dg Chest 2 View  Result Date: 11/11/2015 CLINICAL DATA:  Recent ear and sinus infection which spike began today. Seen at primary care physician today and started on prednisone with a steroid injection. Now has begun to experience right-sided chest pain with radiation to the back. History of hypertension and right breast cancer. EXAM: CHEST  2 VIEW COMPARISON:  None. FINDINGS: Normal heart size and pulmonary vascularity. Mild central interstitial changes may indicate chronic bronchitis. No focal airspace disease or consolidation. No blunting of costophrenic angles. No pneumothorax. Mediastinal contours appear intact. Surgical clips in  the right breast and right upper quadrant. Mild thoracic scoliosis convex towards the right. IMPRESSION: Probable chronic bronchitic changes in the lungs. No evidence of active pulmonary disease. Electronically Signed   By: Lucienne Capers M.D.   On: 11/11/2015 00:14    Procedures Procedures (including critical care time)  Medications Ordered in ED Medications - No data to display   Initial Impression / Assessment and Plan / ED Course  I have reviewed the triage vital signs and the nursing notes.  Pertinent labs & imaging results that were available during my care of the patient were reviewed by me and considered in my medical decision making (see chart for details).  Clinical Course    EKG Interpretation  Date/Time:  Saturday November 10 2015 23:46:46 EDT Ventricular Rate:  103 PR Interval:    QRS Duration: 88 QT Interval:  343 QTC Calculation: 449 R Axis:   4 Text Interpretation:  Sinus tachycardia RSR' in V1 or V2, probably normal variant Confirmed by [facility]  MD, Coleen Cardiff ([id number]) on 11/11/2015 12:04:54 AM      Results for orders placed or performed during the hospital encounter of 11/10/15  CBC with Differential/Platelet  Result Value Ref Range   WBC 8.9 4.0 - 10.5  K/uL   RBC 6.14 (H) 3.87 - 5.11 MIL/uL   Hemoglobin 19.0 (H) 12.0 - 15.0 g/dL   HCT 56.4 (H) 36.0 - 46.0 %   MCV 91.9 78.0 - 100.0 fL   MCH 30.9 26.0 - 34.0 pg   MCHC 33.7 30.0 - 36.0 g/dL   RDW 13.9 11.5 - 15.5 %   Platelets 237 150 - 400 K/uL   Neutrophils Relative % 83 %   Neutro Abs 7.3 1.7 - 7.7 K/uL   Lymphocytes Relative 12 %   Lymphs Abs 1.1 0.7 - 4.0 K/uL   Monocytes Relative 5 %   Monocytes Absolute 0.4 0.1 - 1.0 K/uL   Eosinophils Relative 0 %   Eosinophils Absolute 0.0 0.0 - 0.7 K/uL   Basophils Relative 0 %   Basophils Absolute 0.0 0.0 - 0.1 K/uL  I-Stat Chem 8, ED  Result Value Ref Range   Sodium 140 135 - 145 mmol/L   Potassium 4.3 3.5 - 5.1 mmol/L   Chloride 106 101 - 111 mmol/L   BUN 20 6 - 20 mg/dL   Creatinine, Ser 0.60 0.44 - 1.00 mg/dL   Glucose, Bld 115 (H) 65 - 99 mg/dL   Calcium, Ion 0.97 (L) 1.15 - 1.40 mmol/L   TCO2 21 0 - 100 mmol/L   Hemoglobin 19.4 (H) 12.0 - 15.0 g/dL   HCT 57.0 (H) 36.0 - 46.0 %  I-stat troponin, ED  Result Value Ref Range   Troponin i, poc 0.00 0.00 - 0.08 ng/mL   Comment 3          I-stat troponin, ED  Result Value Ref Range   Troponin i, poc 0.00 0.00 - 0.08 ng/mL   Comment 3           Dg Chest 2 View  Result Date: 11/11/2015 CLINICAL DATA:  Recent ear and sinus infection which spike began today. Seen at primary care physician today and started on prednisone with a steroid injection. Now has begun to experience right-sided chest pain with radiation to the back. History of hypertension and right breast cancer. EXAM: CHEST  2 VIEW COMPARISON:  None. FINDINGS: Normal heart size and pulmonary vascularity. Mild central interstitial changes may indicate chronic bronchitis. No focal airspace disease  or consolidation. No blunting of costophrenic angles. No pneumothorax. Mediastinal contours appear intact. Surgical clips in the right breast and right upper quadrant. Mild thoracic scoliosis convex towards the right. IMPRESSION:  Probable chronic bronchitic changes in the lungs. No evidence of active pulmonary disease. Electronically Signed   By: Lucienne Capers M.D.   On: 11/11/2015 00:14   Medications  gi cocktail (Maalox,Lidocaine,Donnatal) (30 mLs Oral Given 11/11/15 0022)     Final Clinical Impressions(s) / ED Diagnoses   New Prescriptions New Prescriptions   No medications on file  HEART score 1, low risk for MACE.  Symptoms highly atypical for cardiac chest pain.  Highly doubt PE in this very low risk patient.  Symptoms consistent with adverse drug reaction.  No more alcohol while taking medication.  All questions answered to patient's satisfaction. Based on history and exam patient has been appropriately medically screened and emergency conditions excluded. Patient is stable for discharge at this time. Follow up with your PMD for recheck in 2 days and strict return precautions given.   I personally performed the services described in this documentation, which was scribed in my presence. The recorded information has been reviewed and is accurate.      Veatrice Kells, MD 11/11/15 [phone]

## 2015-11-13 DIAGNOSIS — N390 Urinary tract infection, site not specified: Secondary | ICD-10-CM | POA: Diagnosis not present

## 2015-11-13 DIAGNOSIS — R350 Frequency of micturition: Secondary | ICD-10-CM | POA: Diagnosis not present

## 2015-11-13 DIAGNOSIS — Z6824 Body mass index (BMI) 24.0-24.9, adult: Secondary | ICD-10-CM | POA: Diagnosis not present

## 2015-11-13 DIAGNOSIS — Z713 Dietary counseling and surveillance: Secondary | ICD-10-CM | POA: Diagnosis not present

## 2015-11-21 DIAGNOSIS — Z23 Encounter for immunization: Secondary | ICD-10-CM | POA: Diagnosis not present

## 2015-12-28 DIAGNOSIS — Z7189 Other specified counseling: Secondary | ICD-10-CM | POA: Diagnosis not present

## 2015-12-28 DIAGNOSIS — Z79899 Other long term (current) drug therapy: Secondary | ICD-10-CM | POA: Diagnosis not present

## 2015-12-28 DIAGNOSIS — Z299 Encounter for prophylactic measures, unspecified: Secondary | ICD-10-CM | POA: Diagnosis not present

## 2015-12-28 DIAGNOSIS — Z6825 Body mass index (BMI) 25.0-25.9, adult: Secondary | ICD-10-CM | POA: Diagnosis not present

## 2015-12-28 DIAGNOSIS — Z1389 Encounter for screening for other disorder: Secondary | ICD-10-CM | POA: Diagnosis not present

## 2015-12-28 DIAGNOSIS — E7800 Pure hypercholesterolemia, unspecified: Secondary | ICD-10-CM | POA: Diagnosis not present

## 2015-12-28 DIAGNOSIS — Z0000 Encounter for general adult medical examination without abnormal findings: Secondary | ICD-10-CM | POA: Diagnosis not present

## 2015-12-28 DIAGNOSIS — R5383 Other fatigue: Secondary | ICD-10-CM | POA: Diagnosis not present

## 2015-12-28 DIAGNOSIS — Z1211 Encounter for screening for malignant neoplasm of colon: Secondary | ICD-10-CM | POA: Diagnosis not present

## 2016-01-07 DIAGNOSIS — Z9889 Other specified postprocedural states: Secondary | ICD-10-CM | POA: Diagnosis not present

## 2016-01-07 DIAGNOSIS — Z1231 Encounter for screening mammogram for malignant neoplasm of breast: Secondary | ICD-10-CM | POA: Diagnosis not present

## 2016-01-15 DIAGNOSIS — Z299 Encounter for prophylactic measures, unspecified: Secondary | ICD-10-CM | POA: Diagnosis not present

## 2016-01-15 DIAGNOSIS — I10 Essential (primary) hypertension: Secondary | ICD-10-CM | POA: Diagnosis not present

## 2016-01-15 DIAGNOSIS — Z6824 Body mass index (BMI) 24.0-24.9, adult: Secondary | ICD-10-CM | POA: Diagnosis not present

## 2016-01-15 DIAGNOSIS — E7800 Pure hypercholesterolemia, unspecified: Secondary | ICD-10-CM | POA: Diagnosis not present

## 2016-01-15 DIAGNOSIS — M79604 Pain in right leg: Secondary | ICD-10-CM | POA: Diagnosis not present

## 2016-01-21 DIAGNOSIS — M79604 Pain in right leg: Secondary | ICD-10-CM | POA: Diagnosis not present

## 2016-01-24 DIAGNOSIS — E2839 Other primary ovarian failure: Secondary | ICD-10-CM | POA: Diagnosis not present

## 2016-01-25 DIAGNOSIS — R350 Frequency of micturition: Secondary | ICD-10-CM | POA: Diagnosis not present

## 2016-01-25 DIAGNOSIS — N390 Urinary tract infection, site not specified: Secondary | ICD-10-CM | POA: Diagnosis not present

## 2016-01-25 DIAGNOSIS — J309 Allergic rhinitis, unspecified: Secondary | ICD-10-CM | POA: Diagnosis not present

## 2016-01-25 DIAGNOSIS — Z299 Encounter for prophylactic measures, unspecified: Secondary | ICD-10-CM | POA: Diagnosis not present

## 2016-02-26 DIAGNOSIS — M8580 Other specified disorders of bone density and structure, unspecified site: Secondary | ICD-10-CM | POA: Diagnosis not present

## 2016-04-07 DIAGNOSIS — H40023 Open angle with borderline findings, high risk, bilateral: Secondary | ICD-10-CM | POA: Diagnosis not present

## 2016-04-07 DIAGNOSIS — H04123 Dry eye syndrome of bilateral lacrimal glands: Secondary | ICD-10-CM | POA: Diagnosis not present

## 2016-04-07 DIAGNOSIS — Z961 Presence of intraocular lens: Secondary | ICD-10-CM | POA: Diagnosis not present

## 2016-04-07 DIAGNOSIS — H35372 Puckering of macula, left eye: Secondary | ICD-10-CM | POA: Diagnosis not present

## 2016-07-08 DIAGNOSIS — Z6825 Body mass index (BMI) 25.0-25.9, adult: Secondary | ICD-10-CM | POA: Diagnosis not present

## 2016-07-08 DIAGNOSIS — I10 Essential (primary) hypertension: Secondary | ICD-10-CM | POA: Diagnosis not present

## 2016-07-08 DIAGNOSIS — E7800 Pure hypercholesterolemia, unspecified: Secondary | ICD-10-CM | POA: Diagnosis not present

## 2016-07-08 DIAGNOSIS — Z713 Dietary counseling and surveillance: Secondary | ICD-10-CM | POA: Diagnosis not present

## 2016-07-08 DIAGNOSIS — Z299 Encounter for prophylactic measures, unspecified: Secondary | ICD-10-CM | POA: Diagnosis not present

## 2016-10-16 DIAGNOSIS — H40023 Open angle with borderline findings, high risk, bilateral: Secondary | ICD-10-CM | POA: Diagnosis not present

## 2016-11-27 DIAGNOSIS — Z23 Encounter for immunization: Secondary | ICD-10-CM | POA: Diagnosis not present

## 2017-01-14 DIAGNOSIS — Z79899 Other long term (current) drug therapy: Secondary | ICD-10-CM | POA: Diagnosis not present

## 2017-01-14 DIAGNOSIS — Z299 Encounter for prophylactic measures, unspecified: Secondary | ICD-10-CM | POA: Diagnosis not present

## 2017-01-14 DIAGNOSIS — Z0000 Encounter for general adult medical examination without abnormal findings: Secondary | ICD-10-CM | POA: Diagnosis not present

## 2017-01-14 DIAGNOSIS — R5383 Other fatigue: Secondary | ICD-10-CM | POA: Diagnosis not present

## 2017-01-14 DIAGNOSIS — Z1331 Encounter for screening for depression: Secondary | ICD-10-CM | POA: Diagnosis not present

## 2017-01-14 DIAGNOSIS — E7800 Pure hypercholesterolemia, unspecified: Secondary | ICD-10-CM | POA: Diagnosis not present

## 2017-01-14 DIAGNOSIS — Z7189 Other specified counseling: Secondary | ICD-10-CM | POA: Diagnosis not present

## 2017-01-14 DIAGNOSIS — Z1339 Encounter for screening examination for other mental health and behavioral disorders: Secondary | ICD-10-CM | POA: Diagnosis not present

## 2017-01-14 DIAGNOSIS — Z6825 Body mass index (BMI) 25.0-25.9, adult: Secondary | ICD-10-CM | POA: Diagnosis not present

## 2017-02-17 DIAGNOSIS — Z1231 Encounter for screening mammogram for malignant neoplasm of breast: Secondary | ICD-10-CM | POA: Diagnosis not present

## 2017-04-06 DIAGNOSIS — K130 Diseases of lips: Secondary | ICD-10-CM | POA: Diagnosis not present

## 2017-06-15 DIAGNOSIS — Z87891 Personal history of nicotine dependence: Secondary | ICD-10-CM | POA: Diagnosis not present

## 2017-06-15 DIAGNOSIS — J069 Acute upper respiratory infection, unspecified: Secondary | ICD-10-CM | POA: Diagnosis not present

## 2017-06-15 DIAGNOSIS — Z6825 Body mass index (BMI) 25.0-25.9, adult: Secondary | ICD-10-CM | POA: Diagnosis not present

## 2017-06-15 DIAGNOSIS — I10 Essential (primary) hypertension: Secondary | ICD-10-CM | POA: Diagnosis not present

## 2017-06-15 DIAGNOSIS — Z299 Encounter for prophylactic measures, unspecified: Secondary | ICD-10-CM | POA: Diagnosis not present

## 2017-07-21 DIAGNOSIS — E7800 Pure hypercholesterolemia, unspecified: Secondary | ICD-10-CM | POA: Diagnosis not present

## 2017-07-21 DIAGNOSIS — Z713 Dietary counseling and surveillance: Secondary | ICD-10-CM | POA: Diagnosis not present

## 2017-07-21 DIAGNOSIS — Z6826 Body mass index (BMI) 26.0-26.9, adult: Secondary | ICD-10-CM | POA: Diagnosis not present

## 2017-07-21 DIAGNOSIS — Z299 Encounter for prophylactic measures, unspecified: Secondary | ICD-10-CM | POA: Diagnosis not present

## 2017-07-21 DIAGNOSIS — I10 Essential (primary) hypertension: Secondary | ICD-10-CM | POA: Diagnosis not present

## 2017-07-27 DIAGNOSIS — H40023 Open angle with borderline findings, high risk, bilateral: Secondary | ICD-10-CM | POA: Diagnosis not present

## 2017-07-27 DIAGNOSIS — Z961 Presence of intraocular lens: Secondary | ICD-10-CM | POA: Diagnosis not present

## 2017-08-26 DIAGNOSIS — S61452A Open bite of left hand, initial encounter: Secondary | ICD-10-CM | POA: Diagnosis not present

## 2017-08-26 DIAGNOSIS — W540XXA Bitten by dog, initial encounter: Secondary | ICD-10-CM | POA: Diagnosis not present

## 2017-08-26 DIAGNOSIS — C4401 Basal cell carcinoma of skin of lip: Secondary | ICD-10-CM | POA: Diagnosis not present

## 2017-09-10 DIAGNOSIS — K130 Diseases of lips: Secondary | ICD-10-CM | POA: Diagnosis not present

## 2017-09-10 DIAGNOSIS — C4401 Basal cell carcinoma of skin of lip: Secondary | ICD-10-CM | POA: Diagnosis not present

## 2017-10-02 DIAGNOSIS — H52222 Regular astigmatism, left eye: Secondary | ICD-10-CM | POA: Diagnosis not present

## 2017-10-02 DIAGNOSIS — H40023 Open angle with borderline findings, high risk, bilateral: Secondary | ICD-10-CM | POA: Diagnosis not present

## 2017-10-02 DIAGNOSIS — H5213 Myopia, bilateral: Secondary | ICD-10-CM | POA: Diagnosis not present

## 2017-10-02 DIAGNOSIS — H524 Presbyopia: Secondary | ICD-10-CM | POA: Diagnosis not present

## 2017-10-02 DIAGNOSIS — Z961 Presence of intraocular lens: Secondary | ICD-10-CM | POA: Diagnosis not present

## 2017-10-06 DIAGNOSIS — Z299 Encounter for prophylactic measures, unspecified: Secondary | ICD-10-CM | POA: Diagnosis not present

## 2017-10-06 DIAGNOSIS — Z6826 Body mass index (BMI) 26.0-26.9, adult: Secondary | ICD-10-CM | POA: Diagnosis not present

## 2017-10-06 DIAGNOSIS — I10 Essential (primary) hypertension: Secondary | ICD-10-CM | POA: Diagnosis not present

## 2017-10-06 DIAGNOSIS — L255 Unspecified contact dermatitis due to plants, except food: Secondary | ICD-10-CM | POA: Diagnosis not present

## 2017-10-06 DIAGNOSIS — E785 Hyperlipidemia, unspecified: Secondary | ICD-10-CM | POA: Diagnosis not present

## 2017-10-13 DIAGNOSIS — C4401 Basal cell carcinoma of skin of lip: Secondary | ICD-10-CM | POA: Diagnosis not present

## 2017-10-15 ENCOUNTER — Other Ambulatory Visit: Payer: Self-pay | Admitting: Otolaryngology

## 2017-10-15 DIAGNOSIS — K130 Diseases of lips: Secondary | ICD-10-CM | POA: Diagnosis not present

## 2017-10-15 DIAGNOSIS — C4401 Basal cell carcinoma of skin of lip: Secondary | ICD-10-CM | POA: Diagnosis not present

## 2017-10-15 DIAGNOSIS — D0439 Carcinoma in situ of skin of other parts of face: Secondary | ICD-10-CM | POA: Diagnosis not present

## 2017-10-15 DIAGNOSIS — L578 Other skin changes due to chronic exposure to nonionizing radiation: Secondary | ICD-10-CM | POA: Diagnosis not present

## 2017-11-05 DIAGNOSIS — Z23 Encounter for immunization: Secondary | ICD-10-CM | POA: Diagnosis not present

## 2017-11-12 DIAGNOSIS — H04123 Dry eye syndrome of bilateral lacrimal glands: Secondary | ICD-10-CM | POA: Diagnosis not present

## 2017-11-24 DIAGNOSIS — Z23 Encounter for immunization: Secondary | ICD-10-CM | POA: Diagnosis not present

## 2017-12-21 DIAGNOSIS — N390 Urinary tract infection, site not specified: Secondary | ICD-10-CM | POA: Diagnosis not present

## 2017-12-21 DIAGNOSIS — Z299 Encounter for prophylactic measures, unspecified: Secondary | ICD-10-CM | POA: Diagnosis not present

## 2017-12-21 DIAGNOSIS — R350 Frequency of micturition: Secondary | ICD-10-CM | POA: Diagnosis not present

## 2017-12-21 DIAGNOSIS — Z6825 Body mass index (BMI) 25.0-25.9, adult: Secondary | ICD-10-CM | POA: Diagnosis not present

## 2017-12-21 DIAGNOSIS — I10 Essential (primary) hypertension: Secondary | ICD-10-CM | POA: Diagnosis not present

## 2018-01-04 DIAGNOSIS — R350 Frequency of micturition: Secondary | ICD-10-CM | POA: Diagnosis not present

## 2018-01-04 DIAGNOSIS — Z299 Encounter for prophylactic measures, unspecified: Secondary | ICD-10-CM | POA: Diagnosis not present

## 2018-01-04 DIAGNOSIS — Z6826 Body mass index (BMI) 26.0-26.9, adult: Secondary | ICD-10-CM | POA: Diagnosis not present

## 2018-01-04 DIAGNOSIS — N390 Urinary tract infection, site not specified: Secondary | ICD-10-CM | POA: Diagnosis not present

## 2018-01-04 DIAGNOSIS — I10 Essential (primary) hypertension: Secondary | ICD-10-CM | POA: Diagnosis not present

## 2018-01-22 DIAGNOSIS — Z7189 Other specified counseling: Secondary | ICD-10-CM | POA: Diagnosis not present

## 2018-01-22 DIAGNOSIS — I10 Essential (primary) hypertension: Secondary | ICD-10-CM | POA: Diagnosis not present

## 2018-01-22 DIAGNOSIS — Z1211 Encounter for screening for malignant neoplasm of colon: Secondary | ICD-10-CM | POA: Diagnosis not present

## 2018-01-22 DIAGNOSIS — Z1339 Encounter for screening examination for other mental health and behavioral disorders: Secondary | ICD-10-CM | POA: Diagnosis not present

## 2018-01-22 DIAGNOSIS — Z299 Encounter for prophylactic measures, unspecified: Secondary | ICD-10-CM | POA: Diagnosis not present

## 2018-01-22 DIAGNOSIS — Z0000 Encounter for general adult medical examination without abnormal findings: Secondary | ICD-10-CM | POA: Diagnosis not present

## 2018-01-22 DIAGNOSIS — E785 Hyperlipidemia, unspecified: Secondary | ICD-10-CM | POA: Diagnosis not present

## 2018-01-22 DIAGNOSIS — Z79899 Other long term (current) drug therapy: Secondary | ICD-10-CM | POA: Diagnosis not present

## 2018-01-22 DIAGNOSIS — Z6826 Body mass index (BMI) 26.0-26.9, adult: Secondary | ICD-10-CM | POA: Diagnosis not present

## 2018-01-22 DIAGNOSIS — E8940 Asymptomatic postprocedural ovarian failure: Secondary | ICD-10-CM | POA: Diagnosis not present

## 2018-01-22 DIAGNOSIS — Z1331 Encounter for screening for depression: Secondary | ICD-10-CM | POA: Diagnosis not present

## 2018-02-18 DIAGNOSIS — Z1231 Encounter for screening mammogram for malignant neoplasm of breast: Secondary | ICD-10-CM | POA: Diagnosis not present

## 2018-03-23 DIAGNOSIS — D049 Carcinoma in situ of skin, unspecified: Secondary | ICD-10-CM | POA: Diagnosis not present

## 2018-03-29 DIAGNOSIS — Z9849 Cataract extraction status, unspecified eye: Secondary | ICD-10-CM | POA: Diagnosis not present

## 2018-03-29 DIAGNOSIS — H40013 Open angle with borderline findings, low risk, bilateral: Secondary | ICD-10-CM | POA: Diagnosis not present

## 2018-03-29 DIAGNOSIS — Z961 Presence of intraocular lens: Secondary | ICD-10-CM | POA: Diagnosis not present

## 2018-04-02 DIAGNOSIS — Z6826 Body mass index (BMI) 26.0-26.9, adult: Secondary | ICD-10-CM | POA: Diagnosis not present

## 2018-04-02 DIAGNOSIS — M2550 Pain in unspecified joint: Secondary | ICD-10-CM | POA: Diagnosis not present

## 2018-04-02 DIAGNOSIS — L570 Actinic keratosis: Secondary | ICD-10-CM | POA: Diagnosis not present

## 2018-04-02 DIAGNOSIS — I10 Essential (primary) hypertension: Secondary | ICD-10-CM | POA: Diagnosis not present

## 2018-04-02 DIAGNOSIS — Z299 Encounter for prophylactic measures, unspecified: Secondary | ICD-10-CM | POA: Diagnosis not present

## 2018-04-30 DIAGNOSIS — J320 Chronic maxillary sinusitis: Secondary | ICD-10-CM | POA: Diagnosis not present

## 2018-04-30 DIAGNOSIS — I10 Essential (primary) hypertension: Secondary | ICD-10-CM | POA: Diagnosis not present

## 2018-04-30 DIAGNOSIS — Z87891 Personal history of nicotine dependence: Secondary | ICD-10-CM | POA: Diagnosis not present

## 2018-04-30 DIAGNOSIS — Z299 Encounter for prophylactic measures, unspecified: Secondary | ICD-10-CM | POA: Diagnosis not present

## 2018-05-10 DIAGNOSIS — Z713 Dietary counseling and surveillance: Secondary | ICD-10-CM | POA: Diagnosis not present

## 2018-05-10 DIAGNOSIS — R0602 Shortness of breath: Secondary | ICD-10-CM | POA: Diagnosis not present

## 2018-05-10 DIAGNOSIS — I959 Hypotension, unspecified: Secondary | ICD-10-CM | POA: Diagnosis present

## 2018-05-10 DIAGNOSIS — I10 Essential (primary) hypertension: Secondary | ICD-10-CM | POA: Diagnosis not present

## 2018-05-10 DIAGNOSIS — R05 Cough: Secondary | ICD-10-CM | POA: Diagnosis not present

## 2018-05-10 DIAGNOSIS — R6889 Other general symptoms and signs: Secondary | ICD-10-CM | POA: Diagnosis not present

## 2018-05-10 DIAGNOSIS — I4891 Unspecified atrial fibrillation: Secondary | ICD-10-CM | POA: Diagnosis not present

## 2018-05-10 DIAGNOSIS — R5383 Other fatigue: Secondary | ICD-10-CM | POA: Diagnosis not present

## 2018-05-10 DIAGNOSIS — I5031 Acute diastolic (congestive) heart failure: Secondary | ICD-10-CM | POA: Diagnosis not present

## 2018-05-10 DIAGNOSIS — R0902 Hypoxemia: Secondary | ICD-10-CM | POA: Diagnosis not present

## 2018-05-10 DIAGNOSIS — R531 Weakness: Secondary | ICD-10-CM | POA: Diagnosis present

## 2018-05-10 DIAGNOSIS — Z6826 Body mass index (BMI) 26.0-26.9, adult: Secondary | ICD-10-CM | POA: Diagnosis not present

## 2018-05-10 DIAGNOSIS — Z87440 Personal history of urinary (tract) infections: Secondary | ICD-10-CM | POA: Diagnosis not present

## 2018-05-10 DIAGNOSIS — J189 Pneumonia, unspecified organism: Secondary | ICD-10-CM | POA: Diagnosis not present

## 2018-05-10 DIAGNOSIS — R918 Other nonspecific abnormal finding of lung field: Secondary | ICD-10-CM | POA: Diagnosis not present

## 2018-05-10 DIAGNOSIS — Z20828 Contact with and (suspected) exposure to other viral communicable diseases: Secondary | ICD-10-CM | POA: Diagnosis not present

## 2018-05-10 DIAGNOSIS — Z299 Encounter for prophylactic measures, unspecified: Secondary | ICD-10-CM | POA: Diagnosis not present

## 2018-05-10 DIAGNOSIS — I499 Cardiac arrhythmia, unspecified: Secondary | ICD-10-CM | POA: Diagnosis not present

## 2018-05-10 DIAGNOSIS — J302 Other seasonal allergic rhinitis: Secondary | ICD-10-CM | POA: Diagnosis present

## 2018-05-10 DIAGNOSIS — R0689 Other abnormalities of breathing: Secondary | ICD-10-CM | POA: Diagnosis not present

## 2018-05-10 DIAGNOSIS — R0600 Dyspnea, unspecified: Secondary | ICD-10-CM | POA: Diagnosis not present

## 2018-05-10 DIAGNOSIS — Z87891 Personal history of nicotine dependence: Secondary | ICD-10-CM | POA: Diagnosis not present

## 2018-05-10 DIAGNOSIS — I110 Hypertensive heart disease with heart failure: Secondary | ICD-10-CM | POA: Diagnosis present

## 2018-05-20 DIAGNOSIS — Z7901 Long term (current) use of anticoagulants: Secondary | ICD-10-CM | POA: Diagnosis not present

## 2018-05-20 DIAGNOSIS — Z792 Long term (current) use of antibiotics: Secondary | ICD-10-CM | POA: Diagnosis not present

## 2018-05-20 DIAGNOSIS — I10 Essential (primary) hypertension: Secondary | ICD-10-CM | POA: Diagnosis not present

## 2018-05-20 DIAGNOSIS — Z87891 Personal history of nicotine dependence: Secondary | ICD-10-CM | POA: Diagnosis not present

## 2018-05-20 DIAGNOSIS — Z9981 Dependence on supplemental oxygen: Secondary | ICD-10-CM | POA: Diagnosis not present

## 2018-05-20 DIAGNOSIS — J189 Pneumonia, unspecified organism: Secondary | ICD-10-CM | POA: Diagnosis not present

## 2018-05-20 DIAGNOSIS — I4891 Unspecified atrial fibrillation: Secondary | ICD-10-CM | POA: Diagnosis not present

## 2018-05-20 DIAGNOSIS — Z87440 Personal history of urinary (tract) infections: Secondary | ICD-10-CM | POA: Diagnosis not present

## 2018-05-21 DIAGNOSIS — I482 Chronic atrial fibrillation, unspecified: Secondary | ICD-10-CM | POA: Diagnosis not present

## 2018-05-21 DIAGNOSIS — Z8701 Personal history of pneumonia (recurrent): Secondary | ICD-10-CM | POA: Diagnosis not present

## 2018-05-21 DIAGNOSIS — I10 Essential (primary) hypertension: Secondary | ICD-10-CM | POA: Diagnosis not present

## 2018-05-21 DIAGNOSIS — Z87891 Personal history of nicotine dependence: Secondary | ICD-10-CM | POA: Diagnosis not present

## 2018-05-21 DIAGNOSIS — I4891 Unspecified atrial fibrillation: Secondary | ICD-10-CM | POA: Diagnosis not present

## 2018-05-21 DIAGNOSIS — R05 Cough: Secondary | ICD-10-CM | POA: Diagnosis not present

## 2018-05-21 DIAGNOSIS — Z9049 Acquired absence of other specified parts of digestive tract: Secondary | ICD-10-CM | POA: Diagnosis not present

## 2018-05-24 DIAGNOSIS — J189 Pneumonia, unspecified organism: Secondary | ICD-10-CM | POA: Diagnosis not present

## 2018-05-24 DIAGNOSIS — Z792 Long term (current) use of antibiotics: Secondary | ICD-10-CM | POA: Diagnosis not present

## 2018-05-24 DIAGNOSIS — I4891 Unspecified atrial fibrillation: Secondary | ICD-10-CM | POA: Diagnosis not present

## 2018-05-24 DIAGNOSIS — Z7901 Long term (current) use of anticoagulants: Secondary | ICD-10-CM | POA: Diagnosis not present

## 2018-05-24 DIAGNOSIS — I10 Essential (primary) hypertension: Secondary | ICD-10-CM | POA: Diagnosis not present

## 2018-05-24 DIAGNOSIS — Z9981 Dependence on supplemental oxygen: Secondary | ICD-10-CM | POA: Diagnosis not present

## 2018-05-26 DIAGNOSIS — R0600 Dyspnea, unspecified: Secondary | ICD-10-CM | POA: Diagnosis not present

## 2018-05-26 NOTE — Progress Notes (Signed)
 Cardiology New Clinic Visit  PCP: Leta KATHEE Fear, MD  Reason for Consult:  Atrial Fibrillation  Cardiology Assessment & Plan: Persistent Atrial Fibrillation: Diagnosed in 05/2018 during hospitalization for CAP. We attempted a TEE/DCCV on 05/21/2018 but had to abort the cardioversion as thrombus could not be excluded. Patient continues to be on Apixaban which we will plan to continue for 8 weeks. We will plan to cardiovert the patient after being on apixaban for 4 weeks, repeat TEE would not be indicated. If patient is unable to obtain a second month supply of apixaban then we will bridge to warfarin prior to cardioversion. In regards to long term anticoagulation, patient has a CHADsVASC score of 14 (age x2, gender, HTN), thus we would recommend long term anti coagulation with either DOAC or warfarin.  - Continue apixaban 5mg  BID  - Patient to contact clinic if unable to obtain second month supply of apixaban - Continue diltiazem - Decrease metoprolol to 50mg  BID   Fatigue: Could be due to multiple etiologies: deconditioning, recovering from recent PNA, afib or medication side effect (metoprolol).  -- Decrease metoprolol   Follow up in 8 weeks, 4 week after cardioversion   History of Present Illness:   Anna Silva is a 78 y.o. female with HTN and atrial fibrillation who is presenting for evaluation and management of atrial fibrillation.  I had the pleasure of meeting Anna Silva in April 2020 when she was admitted to [facility] with a community-acquired pneumonia which was complicated by the presence of atrial fibrillation with rapid ventricular response.  Although initially her rates were difficult to control we were ultimately able to reach adequate rate control with a combination of diltiazem and metoprolol.  Given this was Anna Silva's first episode of atrial fibrillation we decided to pursue a rhythm control strategy with TEE guided cardioversion.  Unfortunately cardioversion had to  be aborted as I could not rule out a left atrial thrombus during TEE.  Further, TEE was complicated by emesis towards the end of the procedure.  There was no apparent aspiration event based on patient's clinical status and chest radiograph.  Today, Anna Silva reports feeling well but has some ongoing fatigue since leaving the hospital.  Fatigue is described as lack of energy and not necessarily dyspnea. The patient denies any recent  orthopnea, PND, leg swelling, changes in weight , changes in appetite, palpitations and dizzy spells.    No Known Allergies  Medications:   Current Outpatient Medications:  .  CARTIA XT 240 mg 24 hr capsule, Take 240 mg by mouth daily., Disp: , Rfl:  .  ELIQUIS 5 mg Tab, Take 5 mg by mouth., Disp: , Rfl:  .  fexofenadine (ALLEGRA) 180 MG tablet, Take 180 mg by mouth daily., Disp: , Rfl:  .  FREESTYLE LITE STRIPS Strp, , Disp: , Rfl:  .  levoFLOXacin (LEVAQUIN) 500 MG tablet, Take 500 mg by mouth daily., Disp: , Rfl:  .  lisinopril (PRINIVIL,ZESTRIL) 10 MG tablet, Take 10 mg by mouth daily., Disp: , Rfl: 12 .  lisinopriL (PRINIVIL,ZESTRIL) 10 MG tablet, Take 10 mg by mouth daily., Disp: , Rfl:  .  metoprolol tartrate (LOPRESSOR) 50 MG tablet, TAKE 1 AND 1/2 TABLETS BY MOUTH TWICE DAILY, Disp: , Rfl:  .  NASACORT 55 mcg nasal inhaler, USE ONE SPRAY IN EACH NOSTRIL TWICE DAILY, Disp: , Rfl:  .  PARoxetine (PAXIL) 20 MG tablet, TAKE 1 TABLET BY MOUTH EVERY DAY, Disp: 30 tablet, Rfl: 6  Past Medical History:  Diagnosis Date  . Anxiety   . Facial basal cell cancer   . History of right breast cancer   . Hypertension     Past Surgical History:  Procedure Laterality Date  . basal cell cancer removed from lip    . cataract  Bilateral   . CESAREAN SECTION    . COLONOSCOPY    . DILATION AND CURETTAGE OF UTERUS    . GALLBLADDER SURGERY    . right breast lumpectomy      Social History: Social History   Tobacco Use  Smoking Status Former Smoker   Smokeless Tobacco Never Used   Social History   Substance and Sexual Activity  Alcohol  Use Yes  . Frequency: 2-3 times a week   Social History   Substance and Sexual Activity  Drug Use No    Family History  Problem Relation Age of Onset  . Cervical cancer Mother   . Lung cancer Daughter   . Heart attack Father     Review of Systems:Pertinent items are noted in HPI.  Objective   Physical Exam: Vitals:   05/26/18 1306  BP: 103/68  Pulse: 64  Temp: 36.9 C (98.4 F)  SpO2: 94%   Vitals:   05/26/18 1306  Weight: 64.9 kg (143 lb)   Body mass index is 23.8 kg/m.  General: NAD, comfortable sitting in chair ENT: moist mucous membranes, patent nares, normal external ears.  Eyes: Sclera anicterus, conjunctiva pink. No xanthelasma.  Neck: Supple, no surgical scars noted, JVP 5 cm of h20. Carotids without bruits Pulm: Posterior lungs fields clear to auscultation bilaterally, with good aeration. Normal excursion and respiratory effort.  Card: Irregular, irregular no murmurs, gallops or rubs ausculation of the chest. Physiologic split s2.  Vasc: +2 radial, dorsalis pedis and posterior tibial pulses bilaterally.  No lower extremity edema. Extremities warm to touch.  Abd: Soft, non tender, non distended. No hepatosplenomegaly. Skin: No rashes or discolorations noted.  Muscularskeletal: No obvious deformities. Normal muscle bulk.  Neuro: Alert and oriented, normal speech pattern. No tremor.  Pysch: Normal affect, non tangentle. Asking appropriate questions.    Test Results ECG: Atrial fibrillation, heart rate at 73 bpm.  Nonspecific T wave changes TEE Interpretation Summary   Possible left atrial chamber thrombus Normal left ventricular systolic function, ejection fraction 55% Dilated left atrium - mild Normal right ventricular systolic function    Labs:  Most recent labs reviewed  on 05/26/2018  05/21/2018 CR 0.67 hgb16 Erla Castor, MD 05/26/2018, 1:34  PM

## 2018-05-27 DIAGNOSIS — Z299 Encounter for prophylactic measures, unspecified: Secondary | ICD-10-CM | POA: Diagnosis not present

## 2018-05-27 DIAGNOSIS — J189 Pneumonia, unspecified organism: Secondary | ICD-10-CM | POA: Diagnosis not present

## 2018-05-27 DIAGNOSIS — I10 Essential (primary) hypertension: Secondary | ICD-10-CM | POA: Diagnosis not present

## 2018-05-27 DIAGNOSIS — Z7901 Long term (current) use of anticoagulants: Secondary | ICD-10-CM | POA: Diagnosis not present

## 2018-05-27 DIAGNOSIS — Z792 Long term (current) use of antibiotics: Secondary | ICD-10-CM | POA: Diagnosis not present

## 2018-05-27 DIAGNOSIS — Z6824 Body mass index (BMI) 24.0-24.9, adult: Secondary | ICD-10-CM | POA: Diagnosis not present

## 2018-05-27 DIAGNOSIS — Z9981 Dependence on supplemental oxygen: Secondary | ICD-10-CM | POA: Diagnosis not present

## 2018-05-27 DIAGNOSIS — I4891 Unspecified atrial fibrillation: Secondary | ICD-10-CM | POA: Diagnosis not present

## 2018-05-31 DIAGNOSIS — Z7901 Long term (current) use of anticoagulants: Secondary | ICD-10-CM | POA: Diagnosis not present

## 2018-05-31 DIAGNOSIS — J189 Pneumonia, unspecified organism: Secondary | ICD-10-CM | POA: Diagnosis not present

## 2018-05-31 DIAGNOSIS — Z792 Long term (current) use of antibiotics: Secondary | ICD-10-CM | POA: Diagnosis not present

## 2018-05-31 DIAGNOSIS — Z9981 Dependence on supplemental oxygen: Secondary | ICD-10-CM | POA: Diagnosis not present

## 2018-05-31 DIAGNOSIS — I4891 Unspecified atrial fibrillation: Secondary | ICD-10-CM | POA: Diagnosis not present

## 2018-05-31 DIAGNOSIS — I10 Essential (primary) hypertension: Secondary | ICD-10-CM | POA: Diagnosis not present

## 2018-06-04 DIAGNOSIS — Z792 Long term (current) use of antibiotics: Secondary | ICD-10-CM | POA: Diagnosis not present

## 2018-06-04 DIAGNOSIS — J189 Pneumonia, unspecified organism: Secondary | ICD-10-CM | POA: Diagnosis not present

## 2018-06-04 DIAGNOSIS — Z7901 Long term (current) use of anticoagulants: Secondary | ICD-10-CM | POA: Diagnosis not present

## 2018-06-04 DIAGNOSIS — I10 Essential (primary) hypertension: Secondary | ICD-10-CM | POA: Diagnosis not present

## 2018-06-04 DIAGNOSIS — I4891 Unspecified atrial fibrillation: Secondary | ICD-10-CM | POA: Diagnosis not present

## 2018-06-04 DIAGNOSIS — Z9981 Dependence on supplemental oxygen: Secondary | ICD-10-CM | POA: Diagnosis not present

## 2018-06-09 DIAGNOSIS — Z9981 Dependence on supplemental oxygen: Secondary | ICD-10-CM | POA: Diagnosis not present

## 2018-06-09 DIAGNOSIS — Z792 Long term (current) use of antibiotics: Secondary | ICD-10-CM | POA: Diagnosis not present

## 2018-06-09 DIAGNOSIS — I4891 Unspecified atrial fibrillation: Secondary | ICD-10-CM | POA: Diagnosis not present

## 2018-06-09 DIAGNOSIS — I10 Essential (primary) hypertension: Secondary | ICD-10-CM | POA: Diagnosis not present

## 2018-06-09 DIAGNOSIS — J189 Pneumonia, unspecified organism: Secondary | ICD-10-CM | POA: Diagnosis not present

## 2018-06-09 DIAGNOSIS — Z7901 Long term (current) use of anticoagulants: Secondary | ICD-10-CM | POA: Diagnosis not present

## 2018-06-16 DIAGNOSIS — Z1159 Encounter for screening for other viral diseases: Secondary | ICD-10-CM | POA: Diagnosis not present

## 2018-06-18 DIAGNOSIS — Z87891 Personal history of nicotine dependence: Secondary | ICD-10-CM | POA: Diagnosis not present

## 2018-06-18 DIAGNOSIS — F419 Anxiety disorder, unspecified: Secondary | ICD-10-CM | POA: Diagnosis not present

## 2018-06-18 DIAGNOSIS — Z7901 Long term (current) use of anticoagulants: Secondary | ICD-10-CM | POA: Diagnosis not present

## 2018-06-18 DIAGNOSIS — R5383 Other fatigue: Secondary | ICD-10-CM | POA: Diagnosis not present

## 2018-06-18 DIAGNOSIS — Z853 Personal history of malignant neoplasm of breast: Secondary | ICD-10-CM | POA: Diagnosis not present

## 2018-06-18 DIAGNOSIS — J189 Pneumonia, unspecified organism: Secondary | ICD-10-CM | POA: Diagnosis not present

## 2018-06-18 DIAGNOSIS — I10 Essential (primary) hypertension: Secondary | ICD-10-CM | POA: Diagnosis not present

## 2018-06-18 DIAGNOSIS — Z85828 Personal history of other malignant neoplasm of skin: Secondary | ICD-10-CM | POA: Diagnosis not present

## 2018-06-18 DIAGNOSIS — Z8701 Personal history of pneumonia (recurrent): Secondary | ICD-10-CM | POA: Diagnosis not present

## 2018-06-18 DIAGNOSIS — Z79899 Other long term (current) drug therapy: Secondary | ICD-10-CM | POA: Diagnosis not present

## 2018-06-18 DIAGNOSIS — Z7951 Long term (current) use of inhaled steroids: Secondary | ICD-10-CM | POA: Diagnosis not present

## 2018-06-18 DIAGNOSIS — I4819 Other persistent atrial fibrillation: Secondary | ICD-10-CM | POA: Diagnosis not present

## 2018-06-22 DIAGNOSIS — D049 Carcinoma in situ of skin, unspecified: Secondary | ICD-10-CM | POA: Diagnosis not present

## 2018-06-22 DIAGNOSIS — C4401 Basal cell carcinoma of skin of lip: Secondary | ICD-10-CM | POA: Diagnosis not present

## 2018-06-24 DIAGNOSIS — Z6823 Body mass index (BMI) 23.0-23.9, adult: Secondary | ICD-10-CM | POA: Diagnosis not present

## 2018-06-24 DIAGNOSIS — Z713 Dietary counseling and surveillance: Secondary | ICD-10-CM | POA: Diagnosis not present

## 2018-06-24 DIAGNOSIS — I4891 Unspecified atrial fibrillation: Secondary | ICD-10-CM | POA: Diagnosis not present

## 2018-06-24 DIAGNOSIS — I10 Essential (primary) hypertension: Secondary | ICD-10-CM | POA: Diagnosis not present

## 2018-06-24 DIAGNOSIS — Z299 Encounter for prophylactic measures, unspecified: Secondary | ICD-10-CM | POA: Diagnosis not present

## 2018-07-09 DIAGNOSIS — I4891 Unspecified atrial fibrillation: Secondary | ICD-10-CM | POA: Diagnosis not present

## 2018-07-19 DIAGNOSIS — M859 Disorder of bone density and structure, unspecified: Secondary | ICD-10-CM | POA: Diagnosis not present

## 2018-07-19 DIAGNOSIS — M8580 Other specified disorders of bone density and structure, unspecified site: Secondary | ICD-10-CM | POA: Diagnosis not present

## 2018-08-11 DIAGNOSIS — Z299 Encounter for prophylactic measures, unspecified: Secondary | ICD-10-CM | POA: Diagnosis not present

## 2018-08-11 DIAGNOSIS — I10 Essential (primary) hypertension: Secondary | ICD-10-CM | POA: Diagnosis not present

## 2018-08-11 DIAGNOSIS — Z6823 Body mass index (BMI) 23.0-23.9, adult: Secondary | ICD-10-CM | POA: Diagnosis not present

## 2018-08-11 DIAGNOSIS — R350 Frequency of micturition: Secondary | ICD-10-CM | POA: Diagnosis not present

## 2018-08-11 DIAGNOSIS — N390 Urinary tract infection, site not specified: Secondary | ICD-10-CM | POA: Diagnosis not present

## 2018-08-11 DIAGNOSIS — I4891 Unspecified atrial fibrillation: Secondary | ICD-10-CM | POA: Diagnosis not present

## 2018-08-26 DIAGNOSIS — J189 Pneumonia, unspecified organism: Secondary | ICD-10-CM | POA: Diagnosis not present

## 2018-08-26 DIAGNOSIS — Z6823 Body mass index (BMI) 23.0-23.9, adult: Secondary | ICD-10-CM | POA: Diagnosis not present

## 2018-08-26 DIAGNOSIS — I4891 Unspecified atrial fibrillation: Secondary | ICD-10-CM | POA: Diagnosis not present

## 2018-08-26 DIAGNOSIS — M4184 Other forms of scoliosis, thoracic region: Secondary | ICD-10-CM | POA: Diagnosis not present

## 2018-08-26 DIAGNOSIS — Z9889 Other specified postprocedural states: Secondary | ICD-10-CM | POA: Diagnosis not present

## 2018-08-26 DIAGNOSIS — Z299 Encounter for prophylactic measures, unspecified: Secondary | ICD-10-CM | POA: Diagnosis not present

## 2018-08-26 DIAGNOSIS — M4855XD Collapsed vertebra, not elsewhere classified, thoracolumbar region, subsequent encounter for fracture with routine healing: Secondary | ICD-10-CM | POA: Diagnosis not present

## 2018-08-26 DIAGNOSIS — M47814 Spondylosis without myelopathy or radiculopathy, thoracic region: Secondary | ICD-10-CM | POA: Diagnosis not present

## 2018-08-26 DIAGNOSIS — Z713 Dietary counseling and surveillance: Secondary | ICD-10-CM | POA: Diagnosis not present

## 2018-08-26 DIAGNOSIS — I10 Essential (primary) hypertension: Secondary | ICD-10-CM | POA: Diagnosis not present

## 2018-09-08 IMAGING — DX DG CHEST 2V
2 series · 2 of 2 positions shown · non-contrast
Comparison: None.

CLINICAL DATA: Recent ear and sinus infection which spike began
today. Seen at primary care physician today and started on
prednisone with a steroid injection. Now has begun to experience
right-sided chest pain with radiation to the back. History of
hypertension and right breast cancer.

EXAM:
CHEST  2 VIEW

[chest pa]
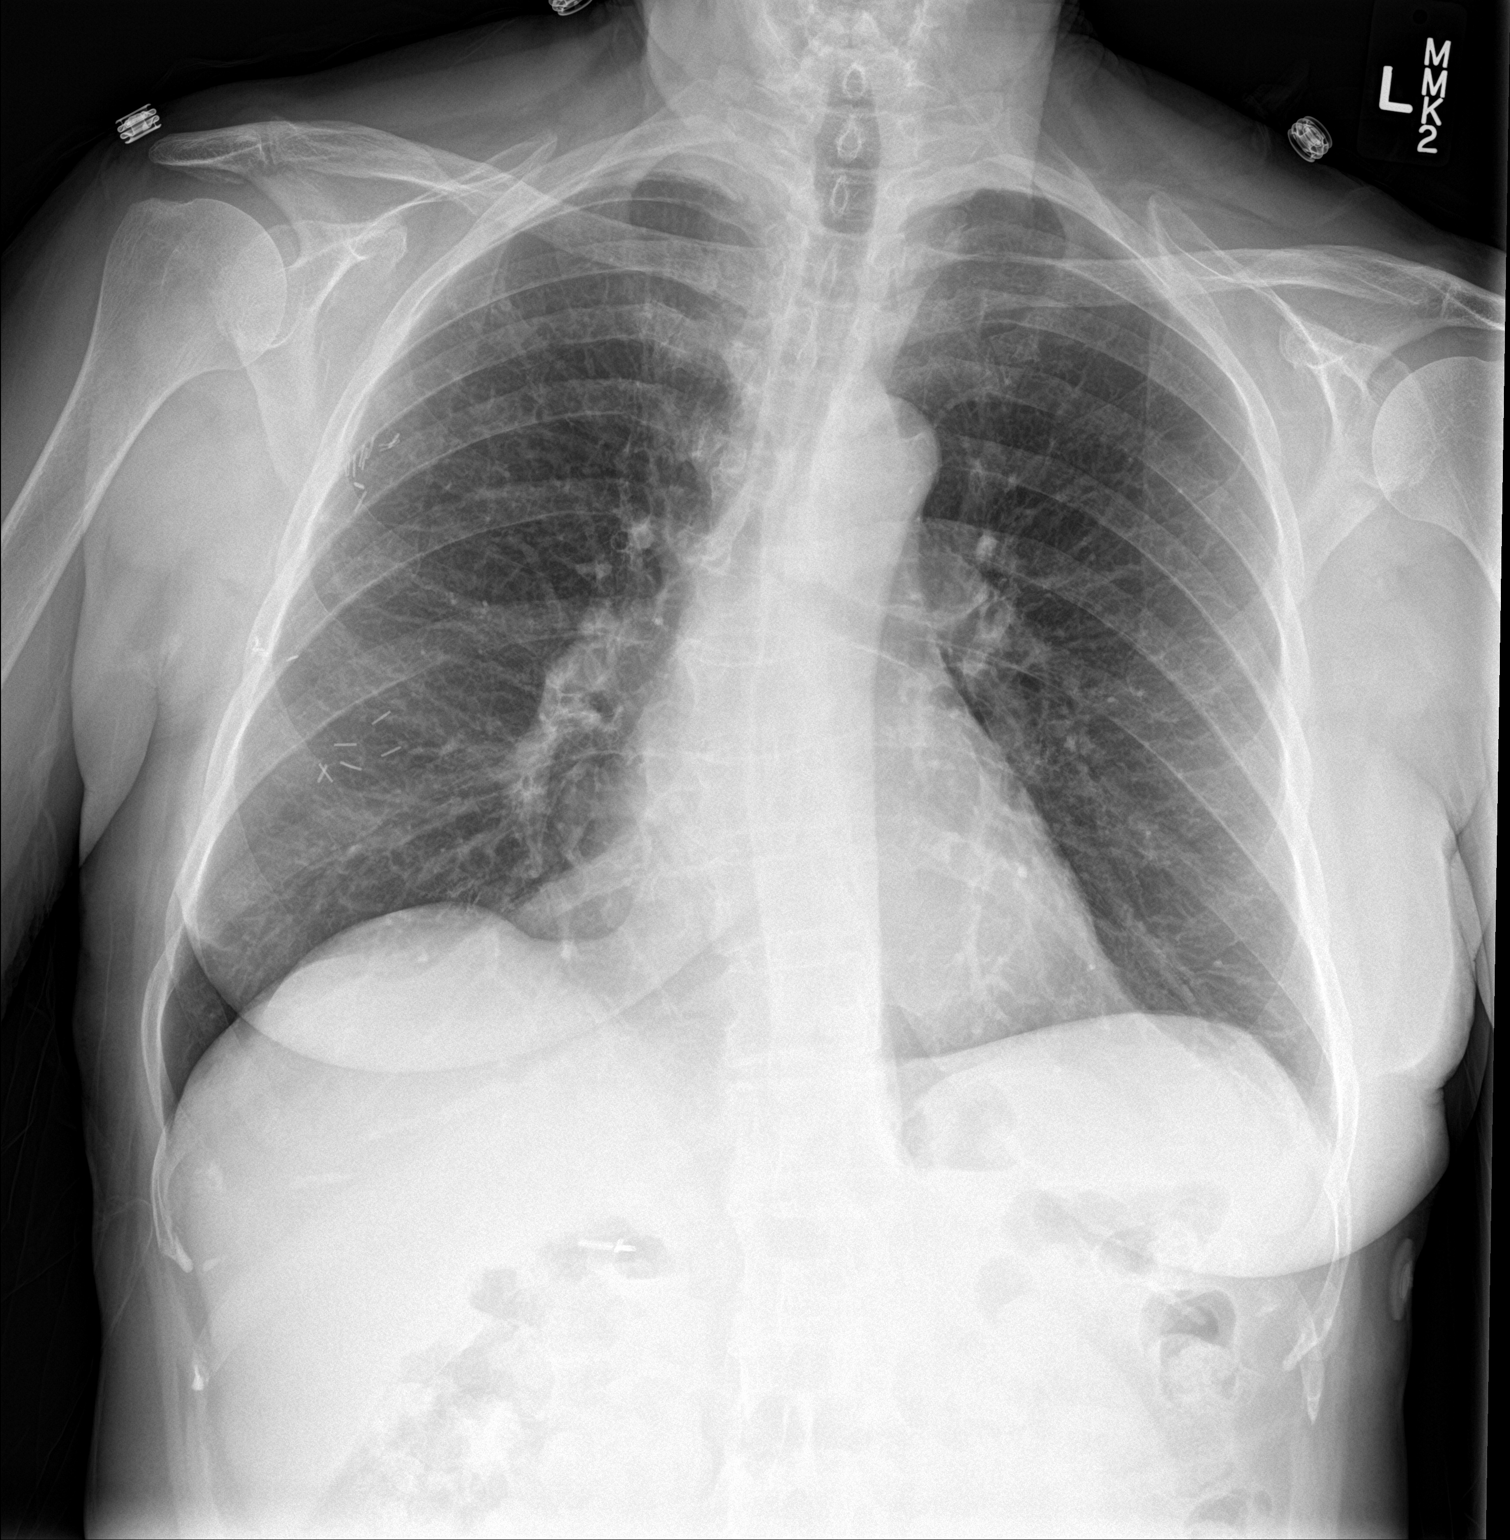

[chest lat]
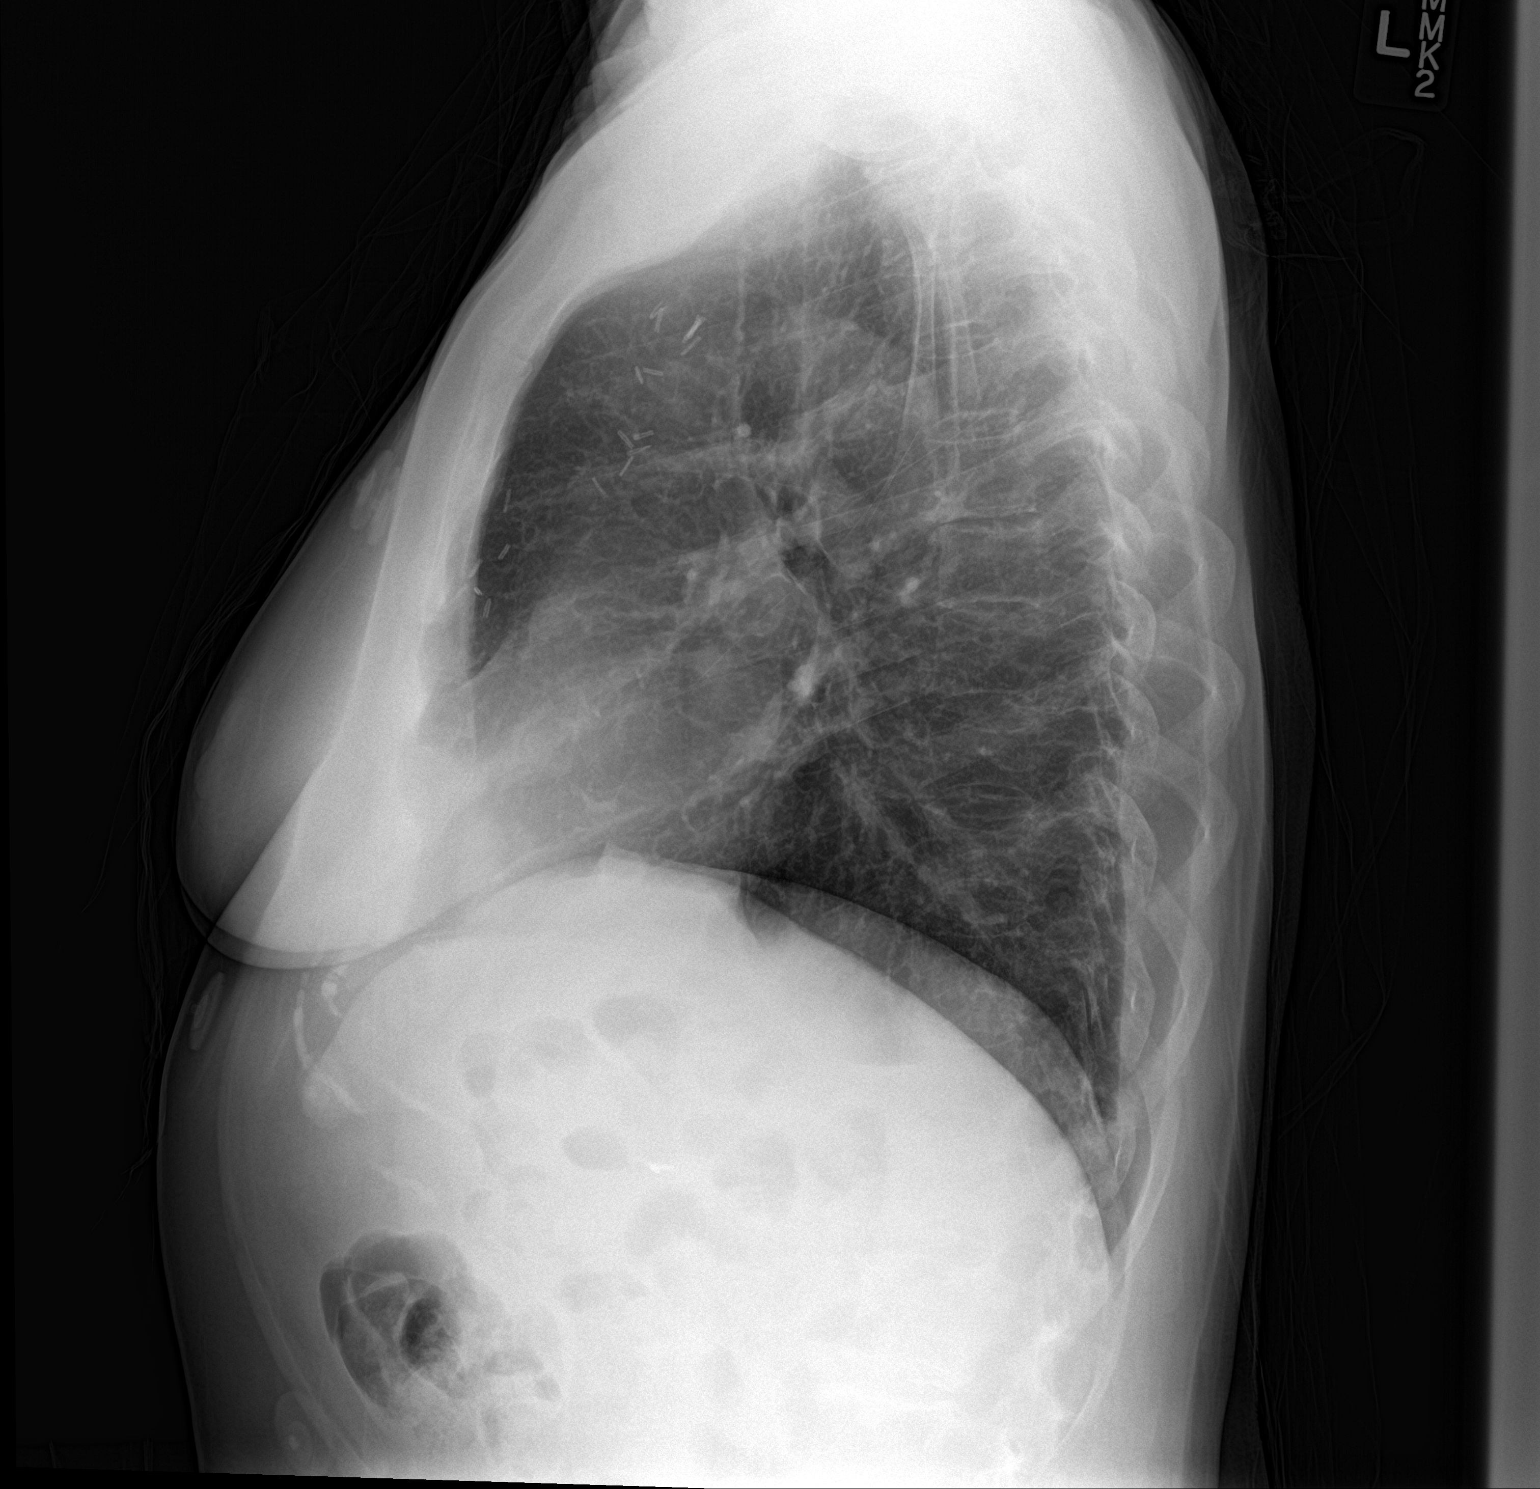

[2 of 2 positions shown; findings below may reference images not displayed]

FINDINGS: Normal heart size and pulmonary vascularity. Mild central
interstitial changes may indicate chronic bronchitis. No focal
airspace disease or consolidation. No blunting of costophrenic
angles. No pneumothorax. Mediastinal contours appear intact.
Surgical clips in the right breast and right upper quadrant. Mild
thoracic scoliosis convex towards the right.
IMPRESSION: Probable chronic bronchitic changes in the lungs. No evidence of
active pulmonary disease.

## 2018-09-23 DIAGNOSIS — I4891 Unspecified atrial fibrillation: Secondary | ICD-10-CM | POA: Diagnosis not present

## 2018-09-23 DIAGNOSIS — I10 Essential (primary) hypertension: Secondary | ICD-10-CM | POA: Diagnosis not present

## 2018-09-23 DIAGNOSIS — Z6822 Body mass index (BMI) 22.0-22.9, adult: Secondary | ICD-10-CM | POA: Diagnosis not present

## 2018-09-23 DIAGNOSIS — Z79899 Other long term (current) drug therapy: Secondary | ICD-10-CM | POA: Diagnosis not present

## 2018-09-23 DIAGNOSIS — E785 Hyperlipidemia, unspecified: Secondary | ICD-10-CM | POA: Diagnosis not present

## 2018-09-23 DIAGNOSIS — Z299 Encounter for prophylactic measures, unspecified: Secondary | ICD-10-CM | POA: Diagnosis not present

## 2018-09-23 DIAGNOSIS — R5383 Other fatigue: Secondary | ICD-10-CM | POA: Diagnosis not present

## 2018-10-04 DIAGNOSIS — H40013 Open angle with borderline findings, low risk, bilateral: Secondary | ICD-10-CM | POA: Diagnosis not present

## 2018-11-15 DIAGNOSIS — Z23 Encounter for immunization: Secondary | ICD-10-CM | POA: Diagnosis not present

## 2018-11-26 DIAGNOSIS — Z23 Encounter for immunization: Secondary | ICD-10-CM | POA: Diagnosis not present

## 2018-11-29 DIAGNOSIS — R300 Dysuria: Secondary | ICD-10-CM | POA: Diagnosis not present

## 2018-11-29 DIAGNOSIS — Z299 Encounter for prophylactic measures, unspecified: Secondary | ICD-10-CM | POA: Diagnosis not present

## 2018-11-29 DIAGNOSIS — N390 Urinary tract infection, site not specified: Secondary | ICD-10-CM | POA: Diagnosis not present

## 2018-11-29 DIAGNOSIS — Z6822 Body mass index (BMI) 22.0-22.9, adult: Secondary | ICD-10-CM | POA: Diagnosis not present

## 2018-11-29 DIAGNOSIS — I10 Essential (primary) hypertension: Secondary | ICD-10-CM | POA: Diagnosis not present

## 2018-11-29 DIAGNOSIS — I4891 Unspecified atrial fibrillation: Secondary | ICD-10-CM | POA: Diagnosis not present

## 2018-12-06 DIAGNOSIS — C4401 Basal cell carcinoma of skin of lip: Secondary | ICD-10-CM | POA: Diagnosis not present

## 2018-12-06 DIAGNOSIS — D049 Carcinoma in situ of skin, unspecified: Secondary | ICD-10-CM | POA: Diagnosis not present

## 2018-12-13 DIAGNOSIS — N390 Urinary tract infection, site not specified: Secondary | ICD-10-CM | POA: Diagnosis not present

## 2018-12-13 DIAGNOSIS — Z6823 Body mass index (BMI) 23.0-23.9, adult: Secondary | ICD-10-CM | POA: Diagnosis not present

## 2018-12-13 DIAGNOSIS — I10 Essential (primary) hypertension: Secondary | ICD-10-CM | POA: Diagnosis not present

## 2018-12-13 DIAGNOSIS — I4819 Other persistent atrial fibrillation: Secondary | ICD-10-CM | POA: Diagnosis not present

## 2018-12-13 DIAGNOSIS — Z299 Encounter for prophylactic measures, unspecified: Secondary | ICD-10-CM | POA: Diagnosis not present

## 2018-12-13 DIAGNOSIS — I4891 Unspecified atrial fibrillation: Secondary | ICD-10-CM | POA: Diagnosis not present

## 2018-12-27 DIAGNOSIS — Z6823 Body mass index (BMI) 23.0-23.9, adult: Secondary | ICD-10-CM | POA: Diagnosis not present

## 2018-12-27 DIAGNOSIS — R350 Frequency of micturition: Secondary | ICD-10-CM | POA: Diagnosis not present

## 2018-12-27 DIAGNOSIS — I10 Essential (primary) hypertension: Secondary | ICD-10-CM | POA: Diagnosis not present

## 2018-12-27 DIAGNOSIS — N390 Urinary tract infection, site not specified: Secondary | ICD-10-CM | POA: Diagnosis not present

## 2018-12-27 DIAGNOSIS — Z299 Encounter for prophylactic measures, unspecified: Secondary | ICD-10-CM | POA: Diagnosis not present

## 2019-01-04 DEATH — deceased

## 2019-01-25 DIAGNOSIS — R5383 Other fatigue: Secondary | ICD-10-CM | POA: Diagnosis not present

## 2019-01-25 DIAGNOSIS — E7800 Pure hypercholesterolemia, unspecified: Secondary | ICD-10-CM | POA: Diagnosis not present

## 2019-01-25 DIAGNOSIS — Z79899 Other long term (current) drug therapy: Secondary | ICD-10-CM | POA: Diagnosis not present

## 2019-02-17 DIAGNOSIS — Z23 Encounter for immunization: Secondary | ICD-10-CM | POA: Diagnosis not present

## 2019-02-21 DIAGNOSIS — Z1231 Encounter for screening mammogram for malignant neoplasm of breast: Secondary | ICD-10-CM | POA: Diagnosis not present

## 2019-03-19 DIAGNOSIS — Z23 Encounter for immunization: Secondary | ICD-10-CM | POA: Diagnosis not present

## 2019-04-04 DIAGNOSIS — H40023 Open angle with borderline findings, high risk, bilateral: Secondary | ICD-10-CM | POA: Diagnosis not present

## 2019-04-20 DIAGNOSIS — C4401 Basal cell carcinoma of skin of lip: Secondary | ICD-10-CM | POA: Diagnosis not present

## 2019-04-20 DIAGNOSIS — D049 Carcinoma in situ of skin, unspecified: Secondary | ICD-10-CM | POA: Diagnosis not present

## 2019-04-27 DIAGNOSIS — I10 Essential (primary) hypertension: Secondary | ICD-10-CM | POA: Diagnosis not present

## 2019-04-27 DIAGNOSIS — I4891 Unspecified atrial fibrillation: Secondary | ICD-10-CM | POA: Diagnosis not present

## 2019-04-27 DIAGNOSIS — Z299 Encounter for prophylactic measures, unspecified: Secondary | ICD-10-CM | POA: Diagnosis not present

## 2019-04-27 DIAGNOSIS — R001 Bradycardia, unspecified: Secondary | ICD-10-CM | POA: Diagnosis not present

## 2019-04-27 DIAGNOSIS — Z87891 Personal history of nicotine dependence: Secondary | ICD-10-CM | POA: Diagnosis not present

## 2019-05-03 DIAGNOSIS — I10 Essential (primary) hypertension: Secondary | ICD-10-CM | POA: Diagnosis not present

## 2019-05-03 DIAGNOSIS — I4891 Unspecified atrial fibrillation: Secondary | ICD-10-CM | POA: Diagnosis not present

## 2019-05-03 DIAGNOSIS — Z299 Encounter for prophylactic measures, unspecified: Secondary | ICD-10-CM | POA: Diagnosis not present

## 2019-05-09 DIAGNOSIS — I4819 Other persistent atrial fibrillation: Secondary | ICD-10-CM | POA: Diagnosis not present

## 2019-05-09 DIAGNOSIS — I10 Essential (primary) hypertension: Secondary | ICD-10-CM | POA: Diagnosis not present

## 2019-05-13 DIAGNOSIS — I10 Essential (primary) hypertension: Secondary | ICD-10-CM | POA: Diagnosis not present

## 2019-05-13 DIAGNOSIS — Z299 Encounter for prophylactic measures, unspecified: Secondary | ICD-10-CM | POA: Diagnosis not present

## 2019-05-13 DIAGNOSIS — R001 Bradycardia, unspecified: Secondary | ICD-10-CM | POA: Diagnosis not present

## 2019-05-13 DIAGNOSIS — I4891 Unspecified atrial fibrillation: Secondary | ICD-10-CM | POA: Diagnosis not present

## 2019-05-23 DIAGNOSIS — I4819 Other persistent atrial fibrillation: Secondary | ICD-10-CM | POA: Diagnosis not present

## 2019-05-23 DIAGNOSIS — I10 Essential (primary) hypertension: Secondary | ICD-10-CM | POA: Diagnosis not present

## 2019-08-03 DIAGNOSIS — I10 Essential (primary) hypertension: Secondary | ICD-10-CM | POA: Diagnosis not present

## 2019-08-03 DIAGNOSIS — I4891 Unspecified atrial fibrillation: Secondary | ICD-10-CM | POA: Diagnosis not present

## 2019-08-22 DIAGNOSIS — I4819 Other persistent atrial fibrillation: Secondary | ICD-10-CM | POA: Diagnosis not present

## 2019-08-22 DIAGNOSIS — I10 Essential (primary) hypertension: Secondary | ICD-10-CM | POA: Diagnosis not present

## 2019-08-22 NOTE — Progress Notes (Signed)
 Cardiology Follow up Clinic Visit  PCP: Leta KATHEE Fear, MD  Reason for follow up:  Persistent atrial Fibrillation status post DCCV  Cardiology Assessment & Plan:  1. Paroxysmal Atrial Fibrillation: Diagnosed in 05/2018 during hospitalization for CAP. TEE/DCCV attempted on 05/21/2018 but had to abort the cardioversion as thrombus could not be excluded. patient was continued on Apixaban for 4 weeks and cardioverted on 06/18/18.  Apixaban to continue for stroke prevention indefinitely (CHADS2VASC- 4 [agex2, HTN, gender]).  - Continue Apixaban 5 mg BID for prophylaxis, she does not report any bleeding and has been able to afford this medication. - Continue Metoprolol to 12.5 mg Daily as tolerating well despite lower heart rate as she is asymptomatic   2. Fatigue: Resolved after cardioversion. Likely related to symptomatic atrial fibrillation.  3. Essential hypertension: BP is adequately controlled at this time on Lisinopril 20 mg daily; up-titrate as needed  Follow up: return in 6 months for re-evaluation  History of Present Illness:   Anna Silva is a 79 y.o. female with HTN and atrial fibrillation who is presenting for evaluation and management of atrial fibrillation.  I had the pleasure of meeting Anna Silva in April 2020 when she was admitted to [facility] with a community-acquired pneumonia which was complicated by the presence of atrial fibrillation with rapid ventricular response.  Although initially her rates were difficult to control we were ultimately able to reach adequate rate control with a combination of diltiazem and metoprolol.  Given this was Anna Silva's first episode of atrial fibrillation we decided to pursue a rhythm control strategy with TEE guided cardioversion.  Unfortunately cardioversion had to be aborted as I could not rule out a left atrial thrombus during TEE.  Further, TEE was complicated by emesis towards the end of the procedure.  There was no apparent  aspiration event based on patient's clinical status and chest radiograph.  05/26/18: Anna Silva reports feeling well but has some ongoing fatigue since leaving the hospital.  Fatigue is described as lack of energy and not necessarily dyspnea. The patient denies any recent  orthopnea, PND, leg swelling, changes in weight , changes in appetite, palpitations and dizzy spells.  Interval hx 07/09/18: Patient underwent DCCV on 06/18/2018. Patient reports feeling much better, no longer fatigued. The patient denies any recent  chest pain, dyspnea, DOE, orthopnea, PND, leg swelling, changes in weight , changes in appetite, palpitations and dizzy spells. Tolerating current medical regimen.   Today:  Doing very well, no symptoms, great energy.  She denies any bleeding on Eliquis.  Denies any new neurologic symptoms or other issues.  BP is much better on new dose of lisinopril.  No Known Allergies  Medications:   Current Outpatient Medications:  .  ELIQUIS 5 mg Tab, Take 5 mg by mouth Two (2) times a day. , Disp: , Rfl:  .  lisinopriL (PRINIVIL,ZESTRIL) 10 MG tablet, Take 1 tablet (10 mg total) by mouth daily. (Patient taking differently: Take 20 mg by mouth daily. ), Disp: 90 tablet, Rfl: 3 .  metoprolol succinate (TOPROL XL) 25 MG 24 hr tablet, Take 0.5 tablets (12.5 mg total) by mouth daily., Disp: 30 tablet, Rfl: 3 .  PARoxetine (PAXIL) 20 MG tablet, TAKE 1 TABLET BY MOUTH EVERY DAY, Disp: 30 tablet, Rfl: 6   Past Medical History:  Diagnosis Date  . Anxiety   . Facial basal cell cancer   . History of right breast cancer   . Hypertension  Past Surgical History:  Procedure Laterality Date  . basal cell cancer removed from lip    . cataract  Bilateral   . CESAREAN SECTION    . COLONOSCOPY    . DILATION AND CURETTAGE OF UTERUS    . GALLBLADDER SURGERY    . right breast lumpectomy      Social History: Social History   Tobacco Use  Smoking Status Former Smoker  . Quit date: 32  .  Years since quitting: 35.5  Smokeless Tobacco Never Used   Social History   Substance and Sexual Activity  Alcohol  Use Not Currently   Social History   Substance and Sexual Activity  Drug Use No    Family History  Problem Relation Age of Onset  . Cervical cancer Mother   . Lung cancer Daughter   . Heart attack Father     Review of Systems:Pertinent items are noted in HPI.  Objective   Physical Exam: Vitals:   08/22/19 1301  BP: 142/78  Pulse: 50  Resp: 18  Temp: 36.7 C (98 F)  SpO2: 98%   Vitals:   08/22/19 1301  Weight: 64 kg (141 lb)   Body mass index is 24.2 kg/m.  General: NAD, comfortable sitting in chair ENT: moist mucous membranes, patent nares, normal external ears.  Eyes: Sclera anicterus, conjunctiva pink. No xanthelasma.  Neck: Supple, no surgical scars noted, JVP 5 cm of h20.  Pulm: Posterior lungs fields clear to auscultation bilaterally, with good aeration. Normal excursion and respiratory effort.  Card: Irregular, irregular no murmurs, gallops or rubs ausculation of the chest. Physiologic split s2.  Vasc: +2 radial, dorsalis pedis and posterior tibial pulses bilaterally.  No lower extremity edema. Extremities warm to touch.  Abd: Soft, non tender, non distended. No hepatosplenomegaly. Skin: No rashes or discolorations noted.  Musculoskeletal: No obvious deformities. Normal muscle bulk.  Neuro: Alert and oriented, normal speech pattern. No tremor.  Pysch: Normal affect. Asking appropriate questions.    Test Results ECG 06/09/2018: Sinus bradycardia at 40bpm.  ECG: Atrial fibrillation, heart rate at 73 bpm.  Nonspecific T wave changes  ECG today:  Sinus bradycardia 40s  Soheil Assar DO FACOI Division of Cardiology  [facility] [employer] [facility]

## 2019-09-23 DIAGNOSIS — I10 Essential (primary) hypertension: Secondary | ICD-10-CM | POA: Diagnosis not present

## 2019-09-23 DIAGNOSIS — Z299 Encounter for prophylactic measures, unspecified: Secondary | ICD-10-CM | POA: Diagnosis not present

## 2019-09-23 DIAGNOSIS — R319 Hematuria, unspecified: Secondary | ICD-10-CM | POA: Diagnosis not present

## 2019-09-23 DIAGNOSIS — R399 Unspecified symptoms and signs involving the genitourinary system: Secondary | ICD-10-CM | POA: Diagnosis not present

## 2019-10-04 DIAGNOSIS — H35363 Drusen (degenerative) of macula, bilateral: Secondary | ICD-10-CM | POA: Diagnosis not present

## 2019-10-04 DIAGNOSIS — I4891 Unspecified atrial fibrillation: Secondary | ICD-10-CM | POA: Diagnosis not present

## 2019-10-04 DIAGNOSIS — I10 Essential (primary) hypertension: Secondary | ICD-10-CM | POA: Diagnosis not present

## 2019-10-13 DIAGNOSIS — D6869 Other thrombophilia: Secondary | ICD-10-CM | POA: Diagnosis not present

## 2019-10-13 DIAGNOSIS — Z299 Encounter for prophylactic measures, unspecified: Secondary | ICD-10-CM | POA: Diagnosis not present

## 2019-10-13 DIAGNOSIS — I10 Essential (primary) hypertension: Secondary | ICD-10-CM | POA: Diagnosis not present

## 2019-10-13 DIAGNOSIS — I4891 Unspecified atrial fibrillation: Secondary | ICD-10-CM | POA: Diagnosis not present

## 2019-10-13 DIAGNOSIS — F322 Major depressive disorder, single episode, severe without psychotic features: Secondary | ICD-10-CM | POA: Diagnosis not present

## 2019-10-21 DIAGNOSIS — Z299 Encounter for prophylactic measures, unspecified: Secondary | ICD-10-CM | POA: Diagnosis not present

## 2019-10-21 DIAGNOSIS — I10 Essential (primary) hypertension: Secondary | ICD-10-CM | POA: Diagnosis not present

## 2019-10-21 DIAGNOSIS — D6869 Other thrombophilia: Secondary | ICD-10-CM | POA: Diagnosis not present

## 2019-10-21 DIAGNOSIS — F322 Major depressive disorder, single episode, severe without psychotic features: Secondary | ICD-10-CM | POA: Diagnosis not present

## 2019-10-21 DIAGNOSIS — I4891 Unspecified atrial fibrillation: Secondary | ICD-10-CM | POA: Diagnosis not present

## 2019-10-21 DIAGNOSIS — W5311XA Bitten by rat, initial encounter: Secondary | ICD-10-CM | POA: Diagnosis not present

## 2019-11-25 DIAGNOSIS — Z23 Encounter for immunization: Secondary | ICD-10-CM | POA: Diagnosis not present

## 2019-12-02 DIAGNOSIS — I10 Essential (primary) hypertension: Secondary | ICD-10-CM | POA: Diagnosis not present

## 2019-12-02 DIAGNOSIS — I4891 Unspecified atrial fibrillation: Secondary | ICD-10-CM | POA: Diagnosis not present

## 2019-12-10 DIAGNOSIS — Z23 Encounter for immunization: Secondary | ICD-10-CM | POA: Diagnosis not present

## 2019-12-27 DIAGNOSIS — Z23 Encounter for immunization: Secondary | ICD-10-CM | POA: Diagnosis not present

## 2020-01-19 DIAGNOSIS — R5383 Other fatigue: Secondary | ICD-10-CM | POA: Diagnosis not present

## 2020-01-19 DIAGNOSIS — Z0000 Encounter for general adult medical examination without abnormal findings: Secondary | ICD-10-CM | POA: Diagnosis not present

## 2020-01-19 DIAGNOSIS — E7800 Pure hypercholesterolemia, unspecified: Secondary | ICD-10-CM | POA: Diagnosis not present

## 2020-01-19 DIAGNOSIS — Z7189 Other specified counseling: Secondary | ICD-10-CM | POA: Diagnosis not present

## 2020-01-19 DIAGNOSIS — Z1339 Encounter for screening examination for other mental health and behavioral disorders: Secondary | ICD-10-CM | POA: Diagnosis not present

## 2020-01-19 DIAGNOSIS — Z1331 Encounter for screening for depression: Secondary | ICD-10-CM | POA: Diagnosis not present

## 2020-01-19 DIAGNOSIS — E559 Vitamin D deficiency, unspecified: Secondary | ICD-10-CM | POA: Diagnosis not present

## 2020-01-19 DIAGNOSIS — Z299 Encounter for prophylactic measures, unspecified: Secondary | ICD-10-CM | POA: Diagnosis not present

## 2020-01-19 DIAGNOSIS — Z79899 Other long term (current) drug therapy: Secondary | ICD-10-CM | POA: Diagnosis not present

## 2020-01-19 DIAGNOSIS — I10 Essential (primary) hypertension: Secondary | ICD-10-CM | POA: Diagnosis not present

## 2022-05-23 ENCOUNTER — Encounter (INDEPENDENT_AMBULATORY_CARE_PROVIDER_SITE_OTHER): Payer: Self-pay | Admitting: *Deleted

## 2022-09-30 NOTE — Progress Notes (Signed)
 [facility] [address]  Phone: [phone] [place], [place] [address]  Fax: [phone]  Date of Service: 09/30/2022  Assessment/Plan:  Permanent atrial fibrillation Doing well, seems to have had better rate control over the past few months. We discussed her apixaban dosing -- it is appropriate for now, but if her weight were to dip below 60 kg (currently 64), she should have her dose reduced to 2.5 mg BID. -apixaban 5 mg BID -metoprolol succinate 50 mg daily  HTN HLD BP and lipids at goal. -BB as above -rosuvastatin 5 mg daily  Return to clinic: Return in about 6 months (around 04/02/2023).  I personally spent 35 minutes face-to-face and non-face-to-face in the care of this patient, which includes all pre, intra, and post visit time on the date of service.  Franky FELIX Gil, MD, [facility] Assistant Professor of Medicine Division of Cardiology [facility]  Subjective:  ERE:Cbjd, Anna NOVAK, MD Patient ID: Anna Silva is an 82 y.o. female patient with permanent AF, HTN, HLD, here for follow-up of AF.  Interval History: Last seen by my colleague Dr. Troy about 6 months ago. At that visit, her HR was a little on the high side and so her metoprolol dose was increased. Since then, she has generally done well with no hospitalizations or ED visits. She remains relatively asymptomatic from the perspective of AF. No CP, dyspnea, orthopnea, PND, palpitations, syncope/presyncope, or LE edema. She has some easy bruising but no overt bleeding issues.  Objective:  Physical Exam: BP 128/78 (BP Site: L Arm, BP Position: Sitting, BP Cuff Size: Medium)   Pulse 88   Temp 36.6 C (97.9 F) (Temporal)   Resp 20   Ht 162.6 cm (5' 4)   Wt 63.8 kg (140 lb 11.2 oz)   BMI 24.15 kg/m  GEN: adult female patient in NAD HEENT: NCAT, sclerae anicteric, OP clear NECK: JVP flat CARD: irregularly irregular  S1/S2 audible  no murmurs, rubs, or  gallops RESP: CTAB, no wheezes, crackles, or ronchi, normal work of breathing ABDO: soft, NT/ND EXTREM: WWP, PPP, no LE edema NEURO: AAO, CN II-XII grossly normal, moving all 4 extremities SKIN: warm and dry  Notable results: Labs:  Lab Results  Component Value Date   WBC 7.3 07/08/2021   HGB 14.0 07/08/2021   HCT 41.5 07/08/2021   PLT 233 07/08/2021   Lab Results  Component Value Date   NA 136 07/08/2021   K 5.0 07/08/2021   CREATININE 0.86 07/08/2021    Lab Results  Component Value Date   CHOL 165 07/08/2021   LDL 89 07/08/2021   HDL 59 07/08/2021   TRIG 92 07/08/2021   Imaging/Other: EKG: atrial fibrillation, rate 100  The ASCVD Risk score (Arnett DK, et al., 2019) failed to calculate.

## 2023-03-30 NOTE — Progress Notes (Signed)
 DIVISION OF CARDIOLOGY [facility]  , [facility] Cardiology Clinic Note Date of Service: 03/30/23     H&P: Anna Silva is a 83 y.o. year-old female w/ a PMHx significant for atrial fibrillation on eliquis, HTN, and HLD. The patient was last seen by Dr. Gil 09/30/2022.    Subjective/Interval Events: - feeling tired - states that energy level has been significantly worse recently - has progressed over the past 6 months - feeling very tired particularly later in the day - is nervous about driving long distances due to fatigue - no SOB, chest pain, LE edema, or DOE - does not have palpitations; has presumably persistent afib - underwent DCCV 06/2018 x1 - pt appears to be taking metoprolol succinate 50 mg bid but will confirm when she goes home today and call us  if the dose is different; took it this morning - no other issues with her medications at this time   ROS  Negative aside from that described above    HOME MEDICATIONS   Current Outpatient Medications  Medication Instructions  . cholecalciferol (vitamin D3-125 mcg (5,000 unit)) (VITAMIN D3) 125 mcg, Daily (standard)  . ELIQUIS 5 mg, 2 times a day (standard)  . metoPROLOL succinate (TOPROL-XL) 50 mg, Oral  . PARoxetine (PAXIL) 20 MG tablet TAKE 1 TABLET BY MOUTH EVERY DAY  . rosuvastatin (CRESTOR) 5 mg, Oral, Daily (standard)    ALLERGIES: No Known Allergies     PHYSICAL EXAM: Vitals:   03/30/23 1335  BP: 120/78  Pulse: 99  Resp: 16  Temp: 36.2 C (97.2 F)  SpO2: 97%   GEN: Awake, alert, NAD CV: Irregularly irregular rhythm, HR in the 90s-low 100s, normal S1 and S2, no obvious MRG PULM: CTAB anteriorly and posteriorly, no increased WOB EXTREMITIES: WWP, no lower extremity edema, distal pulses intact SKIN: No obvious lower extremity rashes or ulcers. NEUROLOGIC: Alert, interactive, and appropriate, grossly  moving all 4 extremities.     OBJECTIVE DATA   Lab Results  Component Value Date   WBC 7.2 07/17/2022   HGB 13.5 07/17/2022   HCT 43.3 07/17/2022   PLT 241 07/17/2022    Lab Results  Component Value Date   NA 137 07/17/2022   K 5.4 (H) 07/17/2022   CL 100 07/17/2022   CO2 22.0 07/17/2022   BUN 15 07/17/2022   CREATININE 0.97 07/17/2022   GLU 78 07/17/2022   CALCIUM 9.5 07/17/2022    Lab Results  Component Value Date   BILITOT 0.6 07/17/2022   PROT 6.7 07/17/2022   ALBUMIN 2.8 (L) 05/19/2018   ALT 20 07/17/2022   AST 27 07/17/2022   ALKPHOS 104 07/17/2022     CARDIODIAGNOSTICS  ECG 09/30/2022:       TTE 04/15/2022:   Summary 1. The left ventricle is normal in size with normal wall thickness. 2. The left ventricular systolic function is normal, LVEF is visually estimated at > 55%. 3. There is mild mitral valve regurgitation. 4. The left atrium is severely dilated in size. 5. The right ventricle is normal in size, with normal systolic function. 6. The right atrium  is severely dilated in size.     ASSESSMENT  Anna Silva is a 83 y.o. year-old female w/ a PMHx significant for atrial fibrillation s/p DCCV on eliquis, HTN, and HLD. The patient was last seen by Dr. Gil 09/30/2022.  The patient is experiencing symptoms of fatigue which she states have worsened over the past several months. Given her history of afib with elevated HR on today's examination, it may be due to inadequate rate control. However, it also appears that there may have been an increase in her metoprolol earlier this year and her symptoms may also be related to medication side effects. We discussed rate and rhythm control strategies today but I think the first step to helping determine how to best treat her afib (ie increasing beta blockade vs alternative rate control therapies vs EP referral for consideration of ablation) is to obtain a Ziopatch to understand how effective her current  rate control therapy is and if she is ever in sinus rhythm. We will plan to follow-up in 6 weeks to discuss the results of this study and further decide how best to proceed.    RECOMMENDATIONS  HTN Well-controlled on metoprolol  HLD Taking rosuvastatin 5 mg once daily Afib s/p DCCV in 2020 Continue metoprolol succinate 50 mg twice daily as prescribed Continue eliquis 5 mg twice daily  Ziopatch today Follow-up in 6 weeks to discuss next steps   Please contact us  for any questions.      [name], MD  I personally spent 30 minutes face-to-face and non-face-to-face in the care of this patient, which includes all pre, intra, and post visit time on the date of service.  All documented time was specific to the E/M visit and does not include any procedures that may have been performed.

## 2023-05-11 NOTE — Progress Notes (Signed)
 DIVISION OF CARDIOLOGY [facility]  , [facility] Cardiology Clinic Note Date of Service: 05/11/23     H&P: Anna Silva is a 83 y.o. year-old female w/ a PMHx significant for atrial fibrillation on eliquis, HTN, and HLD. The patient was last seen by me 03/30/2023.    Subjective/Interval Events 03/30/2023: - feeling tired - states that energy level has been significantly worse recently - has progressed over the past 6 months - feeling very tired particularly later in the day - is nervous about driving long distances due to fatigue - no SOB, chest pain, LE edema, or DOE - does not have palpitations; has presumably persistent afib - underwent DCCV 06/2018 x1 - pt appears to be taking metoprolol succinate 50 mg bid but will confirm when she goes home today and call us  if the dose is different; took it this morning - no other issues with her medications at this time  Subjective/Interval Events 05/11/23:  - pt continues to experience fatigue - ZioPatch shows persistent afib; rates ~100 on average with minimum HR in the 60s - pt continues to take metoprolol succinate 50 mg bid  ROS  Negative aside from that described above    HOME MEDICATIONS   Current Outpatient Medications  Medication Instructions  . cholecalciferol (vitamin D3-125 mcg (5,000 unit)) (VITAMIN D3) 125 mcg, Daily (standard)  . ELIQUIS 5 mg, 2 times a day (standard)  . metoPROLOL succinate (TOPROL-XL) 75 mg, Oral, 2 times a day  . PARoxetine (PAXIL) 20 MG tablet TAKE 1 TABLET BY MOUTH EVERY DAY  . rosuvastatin (CRESTOR) 5 mg, Oral, Daily (standard)    ALLERGIES: No Known Allergies     PHYSICAL EXAM: Vitals:   05/11/23 1244  BP: 120/70  Pulse: 90  Resp: 18  Temp: 36.1 C (97 F)  SpO2: 96%   GEN: Awake, alert, NAD CV: Irregularly irregular rhythm, HR in the 90s-low 100s, normal S1 and S2, no obvious MRG PULM:  CTAB anteriorly and posteriorly, no increased WOB EXTREMITIES: WWP, no lower extremity edema SKIN: No obvious lower extremity rashes or ulcers. NEUROLOGIC: Alert, interactive, and appropriate, grossly moving all 4 extremities.     OBJECTIVE DATA   Lab Results  Component Value Date   WBC 7.2 07/17/2022   HGB 13.5 07/17/2022   HCT 43.3 07/17/2022   PLT 241 07/17/2022    Lab Results  Component Value Date   NA 137 07/17/2022   K 5.4 (H) 07/17/2022   CL 100 07/17/2022   CO2 22.0 07/17/2022   BUN 15 07/17/2022   CREATININE 0.97 07/17/2022   GLU 78 07/17/2022   CALCIUM 9.5 07/17/2022    Lab Results  Component Value Date   BILITOT 0.6 07/17/2022   PROT 6.7 07/17/2022   ALBUMIN 2.8 (L) 05/19/2018   ALT 20 07/17/2022   AST 27 07/17/2022   ALKPHOS 104 07/17/2022     CARDIODIAGNOSTICS  ECG 09/30/2022:       TTE 04/15/2022:   Summary 1. The left ventricle is normal in size with normal wall thickness. 2. The left ventricular systolic function is normal, LVEF is visually  estimated at > 55%. 3. There is mild mitral valve regurgitation. 4. The left atrium is severely dilated in size. 5. The right ventricle is normal in size, with normal systolic function. 6. The right atrium is severely dilated in size.  ZioPatch 03/30/2023:  Conclusions: - Ambulatory ECG monitoring was performed from 03/30/23 to 04/13/23. - The predominant rhythm was atrial fibrillation (100% burden), with the rate ranging from 62 to 165 and averaging 103 bpm - No supraventricular ectopics (PACs) were recorded - Rare ventricular ectopics (PVCs) were recorded with no recorded episodes of wide-complex tachycardia - No patient-initiated recordings/events were submitted. - No pauses >3 seconds detected     ASSESSMENT  Anna Silva is a 83 y.o. year-old female w/ a PMHx significant for atrial fibrillation s/p DCCV on eliquis, HTN, and HLD. The patient was last seen by me 03/30/2023.  The patient has  been experiencing symptoms of fatigue which she states have worsened over the past several months. Given her history of afib with elevated HR, it may be due to inadequate rate control, which is supported by the results of the patient's recent ZioPatch. She underwent prior DCCV but is reticent at this time to discuss ablation. We will continue to uptitrate her beta blockade to try to improve her overall rate control. Today I will increase metoprolol from 50 mg bid to 75 mg bid and will further increase to 100 mg bid at our next appointment should the patient tolerate this change. If she continues to be poorly controlled or symptomatic despite medication titration, I recommended she discuss further options with EP and she is amenable to that when necessary but would like to medically manage first.    RECOMMENDATIONS  HTN Well-controlled on metoprolol  HLD Taking rosuvastatin 5 mg once daily Afib s/p DCCV in 2020 Increase metoprolol succinate from 50 mg twice daily to 75 mg twice daily Continue eliquis 5 mg twice daily, no bleeding events Follow-up in 6 weeks to discuss next steps   Please contact us  for any questions.      Anna Maizes, MD  I personally spent 30 minutes face-to-face and non-face-to-face in the care of this patient, which includes all pre, intra, and post visit time on the date of service.  All documented time was specific to the E/M visit and does not include any procedures that may have been performed.

## 2023-07-13 NOTE — Progress Notes (Signed)
 DIVISION OF CARDIOLOGY [facility] Cardiology Clinic Note Date of Service: 07/13/23     H&P: Anna Silva is a 83 y.o. year-old female w/ a PMHx significant for atrial fibrillation on eliquis, HTN, and HLD. The patient was last seen by me 03/30/2023.    Subjective/Interval Events 03/30/2023: - feeling tired - states that energy level has been significantly worse recently - has progressed over the past 6 months - feeling very tired particularly later in the day - is nervous about driving long distances due to fatigue - no SOB, chest pain, LE edema, or DOE - does not have palpitations; has presumably persistent afib - underwent DCCV 06/2018 x1 - pt appears to be taking metoprolol succinate 50 mg bid but will confirm when she goes home today and call us  if the dose is different; took it this morning - no other issues with her medications at this time  Subjective/Interval Events 05/11/2023: - pt continues to experience fatigue - ZioPatch shows persistent afib; rates ~100 on average with minimum HR in the 60s - pt continues to take metoprolol succinate 50 mg bid  Subjective/Interval Events 07/13/23:  - pt states fatigue has worsened with uptitration of metoprolol - amenable to referral for EP for consideration of ablation - had a UTI during the past month - was taking the increased dose of metoprolol and briefly switched to 50 bid  - now increased back to 75 mg bid but continues to feel more fatigued - no significant bleeding on Eliquis, has occasional scratches but no prolonged bleeding - has to stay off eliquis in the 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 cholecalciferol (vitamin D3-125 mcg (5,000 unit)) (VITAMIN D3)  125 mcg, Daily (standard)  . ELIQUIS 5 mg, 2 times a day (standard)  . metoPROLOL succinate (TOPROL-XL) 75 mg, Oral, 2 times a day  . PARoxetine (PAXIL) 20 MG tablet TAKE 1 TABLET BY MOUTH EVERY DAY  . rosuvastatin (CRESTOR) 5 mg, Oral, Daily (standard)    ALLERGIES: No Known Allergies     PHYSICAL EXAM: Vitals:   07/13/23 1404  Resp: 16  Temp: 36.2 C (97.2 F)  SpO2: 99%  GEN: Awake, alert, NAD CV: Irregularly irregular rhythm, HR in the 90s-low 100s, normal S1 and S2, no obvious MRG PULM: CTAB anteriorly and posteriorly, no increased WOB EXTREMITIES: WWP, no lower extremity edema SKIN: No obvious lower extremity rashes or ulcers. NEUROLOGIC: Alert, interactive, and appropriate, grossly moving all 4 extremities.     OBJECTIVE DATA   Lab Results  Component Value Date   WBC 7.2 07/17/2022   HGB 13.5 07/17/2022   HCT 43.3 07/17/2022   PLT 241 07/17/2022    Lab Results  Component Value Date   NA 137 07/17/2022   K 5.4 (H) 07/17/2022   CL 100 07/17/2022   CO2 22.0 07/17/2022   BUN 15 07/17/2022   CREATININE 0.97 07/17/2022   GLU  78 07/17/2022   CALCIUM 9.5 07/17/2022    Lab Results  Component Value Date   BILITOT 0.6 07/17/2022   PROT 6.7 07/17/2022   ALBUMIN 2.8 (L) 05/19/2018   ALT 20 07/17/2022   AST 27 07/17/2022   ALKPHOS 104 07/17/2022     CARDIODIAGNOSTICS  ECG 09/30/2022:       TTE 04/15/2022:   Summary 1. The left ventricle is normal in size with normal wall thickness. 2. The left ventricular systolic function is normal, LVEF is visually estimated at > 55%. 3. There is mild mitral valve regurgitation. 4. The left atrium is severely dilated in size. 5. The right ventricle is normal in size, with normal systolic function. 6. The right atrium is severely dilated in size.  ZioPatch 03/30/2023:  Conclusions: - Ambulatory ECG monitoring was performed from 03/30/23 to 04/13/23. - The predominant rhythm was atrial fibrillation (100% burden), with  the rate ranging from 62 to 165 and averaging 103 bpm - No supraventricular ectopics (PACs) were recorded - Rare ventricular ectopics (PVCs) were recorded with no recorded episodes of wide-complex tachycardia - No patient-initiated recordings/events were submitted. - No pauses >3 seconds detected     ASSESSMENT  Anna Silva is a 83 y.o. year-old female w/ a PMHx significant for atrial fibrillation s/p DCCV on eliquis, HTN, and HLD. The patient was last seen by me 05/11/2023.  The patient has been experiencing symptoms of fatigue which she states have worsened over the past several months. Given her history of afib with elevated HR, it may be due to inadequate rate control but it has also persisted and even potentially worsened with uptitration of her beta blockade since our last appointment. In light of this, I think a rhythm control strategy such as an ablation is reasonable as a next step and we discussed the risks and benefits of this approach. The patient is amenable to speaking with EP and discussing options further and I will refer her today.   RECOMMENDATIONS  HTN Well-controlled on metoprolol  HLD Taking rosuvastatin 5 mg once daily Afib s/p DCCV in 2020 - now in persistent afib Continue metoprolol succinate 75 mg twice daily Continue eliquis 5 mg twice daily, no bleeding events EP referral for consideration of ablation   Follow-up in 3 months   Please contact us  for any questions.      [name], MD  I personally spent 20 minutes face-to-face and non-face-to-face in the care of this patient, which includes all pre, intra, and post visit time on the date of service.  All documented time was specific to the E/M visit and does not include any procedures that may have been performed.

## 2023-08-10 ENCOUNTER — Encounter: Payer: Self-pay | Admitting: Cardiovascular Disease

## 2023-08-10 ENCOUNTER — Ambulatory Visit: Attending: Cardiovascular Disease | Admitting: Cardiovascular Disease

## 2023-08-10 VITALS — BP 140/88 | HR 96 | Ht 64.0 in | Wt 136.2 lb

## 2023-08-10 DIAGNOSIS — Z01812 Encounter for preprocedural laboratory examination: Secondary | ICD-10-CM

## 2023-08-10 DIAGNOSIS — I4819 Other persistent atrial fibrillation: Secondary | ICD-10-CM

## 2023-08-10 NOTE — Progress Notes (Signed)
 Electrophysiology Office Note:    Date:  08/10/2023   ID:  Anna Silva, DOB [birth year], MRN [id number]  PCP:  Rosamond Leta NOVAK, MD   [facility] Providers Cardiologist:  None     Referring MD: Rosamond Leta NOVAK, MD   History of Present Illness:    Anna Silva is a 83 y.o. female with a medical history significant for atrial fibrillation, hypertension referred for arrhythmia management.      Discussed the use of AI scribe software for clinical note transcription with the patient, who gave verbal consent to proceed.  History of Present Illness Anna Silva is an 83 year old female with persistent atrial fibrillation who presents for arrhythmia management.  She has a history of persistent atrial fibrillation, initially diagnosed in April 2020 during a hospitalization for pneumonia. A cardioversion attempt was aborted at that time due to the presence of a clot, but a successful cardioversion was performed on Jun 18, 2018. However, she eventually returned to atrial fibrillation.  She experiences significant fatigue, which she initially attributed to her medication. She is currently on metoprolol, though the specific dose and frequency are not mentioned. A Ziopatch study conducted through [facility] indicated persistent atrial fibrillation with an average heart rate of about 100 beats per minute. No palpitations are reported, but fatigue remains her primary symptom.  Her past medical history includes a secondary hypercoagulable state. Family history is notable for her father, who died of a heart attack at age 35 and had atrial fibrillation at age 78, experiencing improvement after treatment.  She has expressed frustration with the frequent changes in her healthcare providers, having seen multiple doctors at [facility], including Dr. Brien, Dr. Leona, and Dr. Troy, whom she liked but who moved away.         Today, she reports ongoing fatigue but is otherwise well.  EKGs/Labs/Other  Studies Reviewed Today:     Echocardiogram:  TTE March 2024 [facility] EF greater than 55%.  Left atrium severely dilated.   Monitors:  03/30/2023 day monitor [facility]   100% AF burden with average heart rate 103 bpm   EKG:   EKG Interpretation Date/Time:  Monday August 10 2023 14:32:47 EDT Ventricular Rate:  96 PR Interval:    QRS Duration:  82 QT Interval:  366 QTC Calculation: 462 R Axis:   56  Text Interpretation: Atrial fibrillation When compared with ECG of 10-Nov-2015 23:46, atrial fibrillation has replaced sinus rhythm Confirmed by Nancey Scotts [phone]) on 08/10/2023 2:45:09 PM     Physical Exam:    VS:  BP (!) 140/88 (BP Location: Left Arm, Patient Position: Sitting, Cuff Size: Normal)   Pulse 96   Ht 5' 4 (1.626 m)   Wt 136 lb 3.2 oz (61.8 kg)   SpO2 97%   BMI 23.38 kg/m     Wt Readings from Last 3 Encounters:  08/10/23 136 lb 3.2 oz (61.8 kg)  11/10/15 147 lb (66.7 kg)  05/23/15 150 lb (68 kg)     GEN: Well nourished, well developed in no acute distress CARDIAC: RRR, no murmurs, rubs, gallops RESPIRATORY:  Normal work of breathing MUSCULOSKELETAL: no edema    ASSESSMENT & PLAN:     Atrial fibrillation Persistent Symptomatic with fatigue Had DC cardioversion in May 2020 Atrium is severely enlarged We discussed management options.  Because she is fatigued and not tolerating medications for rate control, also with moderately well-controlled rates, she would prefer a rhythm control approach.  Using a  shared decision making approach, we decided to schedule A-fib ablation.  We discussed the indication, rationale, logistics, anticipated benefits, and potential risks of the ablation procedure including but not limited to -- bleed at the groin access site, chest pain, damage to nearby organs such as the diaphragm, lungs, or esophagus, need for a drainage tube, or prolonged hospitalization. I explained that the risk for stroke, heart attack, need for open chest  surgery, or even death is very low but not zero. she  expressed understanding and wishes to proceed.   Secondary hypercoagulable state Continue Eliquis 5 mg twice daily     Signed, Eulas FORBES Furbish, MD  08/10/2023 3:01 PM    [facility]

## 2023-08-10 NOTE — Patient Instructions (Signed)
 Medication Instructions:  Your physician recommends that you continue on your current medications as directed. Please refer to the Current Medication list given to you today.  *If you need a refill on your cardiac medications before your next appointment, please call your pharmacy*  Lab Work: CBC and BMET - please have pre-procedure lab work completed on Monday 09/28/2023 . This can be done at ANY [facility] near you - no appointment required and this does not have to be fasting. If you have labs (blood work) drawn today and your tests are completely normal, you will receive your results only by: MyChart Message (if you have MyChart) OR A paper copy in the mail If you have any lab test that is abnormal or we need to change your treatment, we will call you to review the results.  Testing/Procedures: Cardiac CT - someone will contact you to schedule this  Your physician has requested that you have cardiac CT. Cardiac computed tomography (CT) is a painless test that uses an x-ray machine to take clear, detailed pictures of your heart. For further information please visit [email or web address] Please follow instruction sheet as given.   Atrial Fibrillation Ablation - scheduled on Wednesday, October 21, 2023. We will be in contact closer to your ablation date with further instructions Your physician has recommended that you have an ablation. Catheter ablation is a medical procedure used to treat some cardiac arrhythmias (irregular heartbeats). During catheter ablation, a long, thin, flexible tube is put into a blood vessel in your groin (upper thigh), or neck. This tube is called an ablation catheter. It is then guided to your heart through the blood vessel. Radio frequency waves destroy small areas of heart tissue where abnormal heartbeats may cause an arrhythmia to start. Please see the instruction sheet given to you today.  Follow-Up: At [facility], you and your health needs are our  priority.  As part of our continuing mission to provide you with exceptional heart care, our providers are all part of one team.  This team includes your primary Cardiologist (physician) and Advanced Practice Providers or APPs (Physician Assistants and Nurse Practitioners) who all work together to provide you with the care you need, when you need it.  Your next appointment:   We will schedule follow up after your ablation  Provider:   Dr Nancey   Cardiac Ablation Cardiac ablation is a procedure to destroy, or ablate, a small amount of heart tissue that is causing problems. The heart has many electrical connections. Sometimes, these connections are abnormal and can cause the heart to beat very fast or irregularly. Ablating the abnormal areas can improve the heart's rhythm or return it to normal. Ablation may be done for people who: Have irregular or rapid heartbeats (arrhythmias). Have Wolff-Parkinson-White syndrome. Have taken medicines for an arrhythmia that did not work or caused side effects. Have a high-risk heartbeat that may be life-threatening. Tell a health care provider about: Any allergies you have. All medicines you are taking, including vitamins, herbs, eye drops, creams, and over-the-counter medicines. Any problems you or family members have had with anesthesia. Any bleeding problems you have. Any surgeries you have had. Any medical conditions you have. Whether you are pregnant or may be pregnant. What are the risks? Your health care provider will talk with you about risks. These may include: Infection. Bruising and bleeding. Stroke or blood clots. Damage to nearby structures or organs. Allergic reaction to medicines or dyes. Needing a pacemaker if the heart gets  damaged. A pacemaker is a device that helps the heart beat normally. Failure of the procedure. A repeat procedure may be needed. What happens before the procedure? Medicines Ask your health care provider  about: Changing or stopping your regular medicines. These include any heart rhythm medicines, diabetes medicines, or blood thinners you take. Taking medicines such as aspirin and ibuprofen. These medicines can thin your blood. Do not take them unless your health care provider tells you to. Taking over-the-counter medicines, vitamins, herbs, and supplements. General instructions Follow instructions from your health care provider about what you may eat and drink. If you will be going home right after the procedure, plan to have a responsible adult: Take you home from the hospital or clinic. You will not be allowed to drive. Care for you for the time you are told. Ask your health care provider what steps will be taken to prevent infection. What happens during the procedure?  An IV will be inserted into one of your veins. You may be given: A sedative. This helps you relax. Anesthesia. This will: Numb certain areas of your body. An incision will be made in your neck or your groin. A needle will be inserted through the incision and into a large vein in your neck or groin. The small, thin tube (catheter) will be inserted through the needle and moved to your heart. A type of X-ray (fluoroscopy) will be used to help guide the catheter and provide images of the heart on a monitor. Dye may be injected through the catheter to help your surgeon see the area of the heart that needs treatment. Electrical currents will be sent from the catheter to destroy heart tissue in certain areas. There are three types of energy that may be used to do this: Heat (radiofrequency energy). Laser energy. Extreme cold (cryoablation). When the tissue has been destroyed, the catheter will be removed. Pressure will be held on the insertion area to prevent bleeding. A bandage (dressing) will be placed over the insertion area. The procedure may vary among health care providers and hospitals. What happens after the  procedure? Your blood pressure, heart rate and rhythm, breathing rate, and blood oxygen  level will be monitored until you leave the hospital or clinic. Your insertion area will be checked for bleeding. You will need to lie still for a few hours. If your groin was used, you will need to keep your leg straight for a few hours after the catheter is removed. This information is not intended to replace advice given to you by your health care provider. Make sure you discuss any questions you have with your health care provider. Document Revised: 07/09/2021 Document Reviewed: 07/09/2021 Elsevier Patient Education  2024 [employer].

## 2023-08-11 NOTE — Addendum Note (Signed)
 Addended by: CASIMIR ALDONA BRAVO on: 08/11/2023 03:01 PM   Modules accepted: Orders

## 2023-09-24 ENCOUNTER — Telehealth (HOSPITAL_COMMUNITY): Payer: Self-pay

## 2023-09-24 ENCOUNTER — Ambulatory Visit: Payer: Self-pay | Admitting: Cardiovascular Disease

## 2023-09-24 ENCOUNTER — Encounter: Payer: Self-pay | Admitting: Internal Medicine

## 2023-09-24 LAB — LAB REPORT - SCANNED: EGFR: 67

## 2023-09-24 NOTE — Telephone Encounter (Signed)
 Attempted to reach patient to discuss upcoming procedure, no answer. Left VM for patient to return call.

## 2023-09-25 NOTE — Telephone Encounter (Signed)
 Patient returned call to discuss upcoming procedure.   CT: not needed Labs: completed.   Any recent signs of acute illness or been started on antibiotics? No Any new medications started? No Any medications to hold? No Any missed doses of blood thinner? No Advised patient to continue taking ANTICOAGULANT: Eliquis (Apixaban) twice daily without missing any doses.  Medication instructions:  On the morning of your procedure DO NOT take any medication., including Eliquis or the procedure may be rescheduled. Nothing to eat or drink after midnight prior to your procedure.  Confirmed patient is scheduled for Atrial Fibrillation Ablation on Tuesday, August 26 with Dr. Eulas Furbish. Instructed patient to arrive at the Main Entrance A at [facility]: [address] [place], [place] [address] and check in at Admitting at 8:00 AM.   Advised of plan to go home the same day and will only stay overnight if medically necessary. You MUST have a responsible adult to drive you home and MUST be with you the first 24 hours after you arrive home or your procedure could be cancelled.  Patient verbalized understanding to all instructions provided and agreed to proceed with procedure.

## 2023-09-28 NOTE — Pre-Procedure Instructions (Signed)
 Instructed patient on the following items: Arrival time 0800 Nothing to eat or drink after midnight No meds AM of procedure Responsible person to drive you home and stay with you for 24 hrs  Have you missed any doses of anti-coagulant Eliquis- takes twice a day, hasn't missed any doses.  Don't take dose morning of procedure.

## 2023-09-29 ENCOUNTER — Ambulatory Visit (HOSPITAL_BASED_OUTPATIENT_CLINIC_OR_DEPARTMENT_OTHER): Admitting: Anesthesiology

## 2023-09-29 ENCOUNTER — Ambulatory Visit (HOSPITAL_COMMUNITY): Admitting: Anesthesiology

## 2023-09-29 ENCOUNTER — Ambulatory Visit (HOSPITAL_COMMUNITY)
Admission: RE | Admit: 2023-09-29 | Discharge: 2023-09-29 | Disposition: A | Discharge status: Home / Self Care | Attending: Cardiovascular Disease | Admitting: Cardiovascular Disease

## 2023-09-29 ENCOUNTER — Other Ambulatory Visit: Payer: Self-pay

## 2023-09-29 ENCOUNTER — Ambulatory Visit (HOSPITAL_COMMUNITY)
Admission: RE | Disposition: A | Discharge status: Home / Self Care | Payer: Self-pay | Source: Home / Self Care | Attending: Cardiovascular Disease

## 2023-09-29 DIAGNOSIS — I119 Hypertensive heart disease without heart failure: Secondary | ICD-10-CM | POA: Insufficient documentation

## 2023-09-29 DIAGNOSIS — Z87891 Personal history of nicotine dependence: Secondary | ICD-10-CM

## 2023-09-29 DIAGNOSIS — D6869 Other thrombophilia: Secondary | ICD-10-CM | POA: Diagnosis not present

## 2023-09-29 DIAGNOSIS — I4819 Other persistent atrial fibrillation: Secondary | ICD-10-CM | POA: Diagnosis present

## 2023-09-29 DIAGNOSIS — Z8249 Family history of ischemic heart disease and other diseases of the circulatory system: Secondary | ICD-10-CM | POA: Insufficient documentation

## 2023-09-29 DIAGNOSIS — I10 Essential (primary) hypertension: Secondary | ICD-10-CM

## 2023-09-29 DIAGNOSIS — Z7901 Long term (current) use of anticoagulants: Secondary | ICD-10-CM | POA: Diagnosis not present

## 2023-09-29 DIAGNOSIS — I4891 Unspecified atrial fibrillation: Secondary | ICD-10-CM | POA: Diagnosis not present

## 2023-09-29 HISTORY — PX: ATRIAL FIBRILLATION ABLATION: EP1191

## 2023-09-29 LAB — POCT ACTIVATED CLOTTING TIME: Activated Clotting Time: 285 s

## 2023-09-29 MED ORDER — HEPARIN SODIUM (PORCINE) 1000 UNIT/ML IJ SOLN
INTRAMUSCULAR | Status: DC | PRN
  Administered 2023-09-29: 4000 [IU] via INTRAVENOUS
  Administered 2023-09-29: 10000 [IU] via INTRAVENOUS

## 2023-09-29 MED ORDER — ACETAMINOPHEN 325 MG PO TABS
650.0000 mg | ORAL_TABLET | ORAL | Status: DC | PRN
Start: 2023-09-29 — End: 2023-09-29

## 2023-09-29 MED ORDER — LIDOCAINE 2% (20 MG/ML) 5 ML SYRINGE
INTRAMUSCULAR | Status: DC | PRN
  Administered 2023-09-29: 40 mg via INTRAVENOUS

## 2023-09-29 MED ORDER — SUGAMMADEX SODIUM 200 MG/2ML IV SOLN
INTRAVENOUS | Status: DC | PRN
  Administered 2023-09-29: 150 mg via INTRAVENOUS

## 2023-09-29 MED ORDER — METOPROLOL SUCCINATE ER 50 MG PO TB24: 50.0000 mg | ORAL_TABLET | Freq: Every day | ORAL | Status: DC

## 2023-09-29 MED ORDER — SODIUM CHLORIDE 0.9 % IV SOLN: INTRAVENOUS | Status: DC

## 2023-09-29 MED ORDER — MORPHINE SULFATE (PF) 2 MG/ML IV SOLN: 2.0000 mg | Freq: Once | INTRAVENOUS | Status: DC

## 2023-09-29 MED ORDER — FENTANYL CITRATE (PF) 250 MCG/5ML IJ SOLN
INTRAMUSCULAR | Status: DC | PRN
  Administered 2023-09-29: 50 ug via INTRAVENOUS

## 2023-09-29 MED ORDER — ONDANSETRON HCL 4 MG/2ML IJ SOLN
INTRAMUSCULAR | Status: DC | PRN
  Administered 2023-09-29: 4 mg via INTRAVENOUS

## 2023-09-29 MED ORDER — FENTANYL CITRATE (PF) 100 MCG/2ML IJ SOLN
INTRAMUSCULAR | Status: AC
  Filled 2023-09-29: qty 2

## 2023-09-29 MED ORDER — ONDANSETRON HCL 4 MG/2ML IJ SOLN
4.0000 mg | Freq: Four times a day (QID) | INTRAMUSCULAR | Status: DC | PRN
Start: 2023-09-29 — End: 2023-09-29

## 2023-09-29 MED ORDER — PROPOFOL 10 MG/ML IV BOLUS
INTRAVENOUS | Status: DC | PRN
  Administered 2023-09-29: 120 mg via INTRAVENOUS

## 2023-09-29 MED ORDER — DEXAMETHASONE SODIUM PHOSPHATE 10 MG/ML IJ SOLN
INTRAMUSCULAR | Status: DC | PRN
  Administered 2023-09-29: 10 mg via INTRAVENOUS

## 2023-09-29 MED ORDER — ATROPINE SULFATE 1 MG/10ML IJ SOSY
PREFILLED_SYRINGE | INTRAMUSCULAR | Status: DC | PRN
  Administered 2023-09-29: 1 mg via INTRAVENOUS

## 2023-09-29 MED ORDER — HEPARIN (PORCINE) IN NACL 1000-0.9 UT/500ML-% IV SOLN
INTRAVENOUS | Status: DC | PRN
Start: 2023-09-29 — End: 2023-09-29
  Administered 2023-09-29 (×3): 500 mL

## 2023-09-29 MED ORDER — ROCURONIUM BROMIDE 10 MG/ML (PF) SYRINGE
PREFILLED_SYRINGE | INTRAVENOUS | Status: DC | PRN
  Administered 2023-09-29: 50 mg via INTRAVENOUS

## 2023-09-29 MED ORDER — PROTAMINE SULFATE 10 MG/ML IV SOLN
INTRAVENOUS | Status: DC | PRN
Start: 2023-09-29 — End: 2023-09-29
  Administered 2023-09-29: 50 mg via INTRAVENOUS

## 2023-09-29 NOTE — Transfer of Care (Signed)
 Immediate Anesthesia Transfer of Care Note  Patient: Anna Silva  Procedure(s) Performed: ATRIAL FIBRILLATION ABLATION  Patient Location: Cath Lab  Anesthesia Type:General  Level of Consciousness: awake and alert   Airway & Oxygen  Therapy: Patient Spontanous Breathing and Patient connected to nasal cannula oxygen   Post-op Assessment: Report given to RN and Post -op Vital signs reviewed and stable  Post vital signs: Reviewed and stable  Last Vitals:  Vitals Value Taken Time  BP    Temp    Pulse 61 09/29/23 11:38  Resp 14 09/29/23 11:38  SpO2 100 % 09/29/23 11:38  Vitals shown include unfiled device data.  Last Pain:  Vitals:   09/29/23 0846  TempSrc:   PainSc: 0-No pain         Complications: There were no known notable events for this encounter.

## 2023-09-29 NOTE — Progress Notes (Signed)
Client up and walked and tolerated well; bilat groins stable, no bleeding or hematoma °

## 2023-09-29 NOTE — Anesthesia Postprocedure Evaluation (Signed)
 Anesthesia Post Note  Patient: Anna Silva  Procedure(s) Performed: ATRIAL FIBRILLATION ABLATION     Patient location during evaluation: PACU Anesthesia Type: General Level of consciousness: awake and alert Pain management: pain level controlled Vital Signs Assessment: post-procedure vital signs reviewed and stable Respiratory status: spontaneous breathing, nonlabored ventilation, respiratory function stable and patient connected to nasal cannula oxygen  Cardiovascular status: blood pressure returned to baseline and stable Postop Assessment: no apparent nausea or vomiting Anesthetic complications: no   There were no known notable events for this encounter.  Last Vitals:  Vitals:   09/29/23 1230 09/29/23 1240  BP: 105/62 108/70  Pulse: (!) 54 (!) 57  Resp: 15 14  Temp:    SpO2: 92% 94%    Last Pain:  Vitals:   09/29/23 1206  TempSrc: Temporal  PainSc:                  Anna Silva

## 2023-09-29 NOTE — Anesthesia Preprocedure Evaluation (Signed)
 Anesthesia Evaluation  Patient identified by MRN, date of birth, ID band Patient awake    Reviewed: Allergy & Precautions, NPO status , Patient's Chart, lab work & pertinent test results  Airway Mallampati: II  TM Distance: >3 FB Neck ROM: Full    Dental no notable dental hx.    Pulmonary neg pulmonary ROS, former smoker   Pulmonary exam normal        Cardiovascular hypertension, Pt. on medications and Pt. on home beta blockers + dysrhythmias Atrial Fibrillation  Rhythm:Irregular Rate:Normal     Neuro/Psych negative neurological ROS  negative psych ROS   GI/Hepatic negative GI ROS, Neg liver ROS,,,  Endo/Other  negative endocrine ROS    Renal/GU negative Renal ROS  negative genitourinary   Musculoskeletal negative musculoskeletal ROS (+)    Abdominal Normal abdominal exam  (+)   Peds  Hematology       Anesthesia Other Findings   Reproductive/Obstetrics                              Anesthesia Physical Anesthesia Plan  ASA: 3  Anesthesia Plan: General   Post-op Pain Management:    Induction: Intravenous  PONV Risk Score and Plan: 3 and Ondansetron , Dexamethasone  and Treatment may vary due to age or medical condition  Airway Management Planned: Mask and Oral ETT  Additional Equipment: None  Intra-op Plan:   Post-operative Plan: Extubation in OR  Informed Consent: I have reviewed the patients History and Physical, chart, labs and discussed the procedure including the risks, benefits and alternatives for the proposed anesthesia with the patient or authorized representative who has indicated his/her understanding and acceptance.     Dental advisory given  Plan Discussed with: CRNA  Anesthesia Plan Comments:         Anesthesia Quick Evaluation

## 2023-09-29 NOTE — Discharge Instructions (Signed)

## 2023-09-29 NOTE — H&P (Signed)
 Electrophysiology Office Note:    Date:  09/29/2023   ID:  Anna Silva, DOB 1940/10/16, MRN [id number]  PCP:  Rosamond Leta NOVAK, MD   [facility] Providers Cardiologist:  None     Referring MD: No ref. provider found   History of Present Illness:    Anna Silva is a 83 y.o. female with a medical history significant for atrial fibrillation, hypertension referred for arrhythmia management.      Discussed the use of AI scribe software for clinical note transcription with the patient, who gave verbal consent to proceed.  History of Present Illness Anna Silva is an 83 year old female with persistent atrial fibrillation who presents for arrhythmia management.  She has a history of persistent atrial fibrillation, initially diagnosed in April 2020 during a hospitalization for pneumonia. A cardioversion attempt was aborted at that time due to the presence of a clot, but a successful cardioversion was performed on Jun 18, 2018. However, she eventually returned to atrial fibrillation.  She experiences significant fatigue, which she initially attributed to her medication. She is currently on metoprolol , though the specific dose and frequency are not mentioned. A Ziopatch study conducted through [facility] indicated persistent atrial fibrillation with an average heart rate of about 100 beats per minute. No palpitations are reported, but fatigue remains her primary symptom.  Her past medical history includes a secondary hypercoagulable state. Family history is notable for her father, who died of a heart attack at age 2 and had atrial fibrillation at age 42, experiencing improvement after treatment.  She has expressed frustration with the frequent changes in her healthcare providers, having seen multiple doctors at [facility], including Dr. Brien, Dr. Leona, and Dr. Troy, whom she liked but who moved away.         Today, she reports ongoing fatigue but is otherwise well. I reviewed the  patient's labs. she  has not missed any doses of anticoagulation, and she took her dose last night. There have been no changes in the patient's diagnoses, medications, or condition since our recent clinic visit.   EKGs/Labs/Other Studies Reviewed Today:     Echocardiogram:  TTE March 2024 [facility] EF greater than 55%.  Left atrium severely dilated.   Monitors:  03/30/2023 day monitor [facility]   100% AF burden with average heart rate 103 bpm   EKG:         Physical Exam:    VS:  BP 139/89   Pulse 90   Temp 97.9 F (36.6 C) (Oral)   Resp 20   Ht 5' 4 (1.626 m)   Wt 62.1 kg   SpO2 97%   BMI 23.52 kg/m     Wt Readings from Last 3 Encounters:  09/29/23 62.1 kg  08/10/23 61.8 kg  11/10/15 66.7 kg     GEN: Well nourished, well developed in no acute distress CARDIAC: RRR, no murmurs, rubs, gallops RESPIRATORY:  Normal work of breathing MUSCULOSKELETAL: no edema    ASSESSMENT & PLAN:     Atrial fibrillation Persistent Symptomatic with fatigue Had DC cardioversion in May 2020 Atrium is severely enlarged We discussed management options.  Because she is fatigued and not tolerating medications for rate control, also with moderately well-controlled rates, she would prefer a rhythm control approach.  Using a shared decision making approach, we decided to schedule A-fib ablation.  We discussed the indication, rationale, logistics, anticipated benefits, and potential risks of the ablation procedure including but not limited to --  bleed at the groin access site, chest pain, damage to nearby organs such as the diaphragm, lungs, or esophagus, need for a drainage tube, or prolonged hospitalization. I explained that the risk for stroke, heart attack, need for open chest surgery, or even death is very low but not zero. she  expressed understanding and wishes to proceed.   Secondary hypercoagulable state Continue Eliquis 5 mg twice daily     Signed, Eulas FORBES Furbish, MD   09/29/2023 9:34 AM    [facility]

## 2023-09-29 NOTE — Anesthesia Procedure Notes (Signed)
 Procedure Name: Intubation Date/Time: 09/29/2023 10:17 AM  Performed by: Julien Manus, CRNAPre-anesthesia Checklist: Patient identified, Emergency Drugs available, Suction available and Patient being monitored Patient Re-evaluated:Patient Re-evaluated prior to induction Oxygen  Delivery Method: Circle System Utilized Preoxygenation: Pre-oxygenation with 100% oxygen  Induction Type: IV induction Ventilation: Mask ventilation without difficulty Laryngoscope Size: Mac and 3 Grade View: Grade II Tube type: Oral Number of attempts: 1 Airway Equipment and Method: Stylet and Oral airway Placement Confirmation: ETT inserted through vocal cords under direct vision, positive ETCO2 and breath sounds checked- equal and bilateral Secured at: 20 cm Tube secured with: Tape Dental Injury: Teeth and Oropharynx as per pre-operative assessment

## 2023-09-30 ENCOUNTER — Telehealth (HOSPITAL_COMMUNITY): Payer: Self-pay

## 2023-09-30 ENCOUNTER — Encounter (HOSPITAL_COMMUNITY): Payer: Self-pay | Admitting: Cardiovascular Disease

## 2023-09-30 ENCOUNTER — Ambulatory Visit (HOSPITAL_COMMUNITY)

## 2023-09-30 MED FILL — Fentanyl Citrate Preservative Free (PF) Inj 100 MCG/2ML: INTRAMUSCULAR | Qty: 1 | Status: AC

## 2023-09-30 NOTE — Telephone Encounter (Signed)
 Spoke with patient to complete post procedure follow up call.  Patient reports no complications with groin sites.   Instructions reviewed with patient:  Remove large bandage at puncture site after 24 hours. It is normal to have bruising, tenderness, mild swelling, and a pea or marble sized lump/knot at the groin site which can take up to three months to resolve.  Get help right away if you notice sudden swelling at the puncture site.  Check your puncture site every day for signs of infection: fever, redness, swelling, pus drainage, warmth, foul odor or excessive pain. If this occurs, please call the office at [phone], to speak with the nurse. Get help right away if your puncture site is bleeding and the bleeding does not stop after applying firm pressure to the area.  You may continue to have skipped beats/ atrial fibrillation during the first several months after your procedure.  It is very important not to miss any doses of your blood thinner Eliquis.    You will follow up with the Afib clinic on 10/26/23 and follow up with the APP or Dr.Augustus Mealor on 01/08/24.   Patient verbalized understanding to all instructions provided.

## 2023-10-12 ENCOUNTER — Telehealth (HOSPITAL_COMMUNITY): Payer: Self-pay

## 2023-10-12 NOTE — Telephone Encounter (Signed)
 Received a call from patient clarifying her follow up appointment dates. States she received a message online about an appointment on 9/15. Advised patient that she has a follow up with the Afib clinic on 9/22 and follow up with Dr. Nancey on 12/5. The appointment on 9/15 appears to be with [facility]. Patient verbalized understanding and appreciated the call.

## 2023-10-14 ENCOUNTER — Telehealth: Payer: Self-pay | Admitting: Cardiovascular Disease

## 2023-10-14 NOTE — Telephone Encounter (Signed)
 Pt would like report from Ablation sent to her other doctors office. Please advise.

## 2023-10-14 NOTE — Telephone Encounter (Signed)
 Patient would like the notes from her ablation sent to [facility] cardiology. Advised that notes will be sent.

## 2023-10-19 NOTE — Progress Notes (Signed)
 DIVISION OF CARDIOLOGY [facility] Note Date of Service: 10/19/23     H&P: Anna Silva is a 83 y.o. year-old female w/ a PMHx significant for atrial fibrillation on eliquis, HTN, and HLD. The patient was last seen by me 03/30/2023.    Subjective/Interval Events 03/30/2023: - feeling tired - states that energy level has been significantly worse recently - has progressed over the past 6 months - feeling very tired particularly later in the day - is nervous about driving long distances due to fatigue - no SOB, chest pain, LE edema, or DOE - does not have palpitations; has presumably persistent afib - underwent DCCV 06/2018 x1 - pt appears to be taking metoprolol  succinate 50 mg bid but will confirm when she goes home today and call us  if the dose is different; took it this morning - no other issues with her medications at this time  Subjective/Interval Events 05/11/2023: - pt continues to experience fatigue - ZioPatch shows persistent afib; rates ~100 on average with minimum HR in the 60s - pt continues to take metoprolol  succinate 50 mg bid  Subjective/Interval Events 07/13/23:  - pt states fatigue has worsened with uptitration of metoprolol  - amenable to referral for EP for consideration of ablation - had a UTI during the past month - was taking the increased dose of metoprolol  and briefly switched to 50 bid  - now increased back to 75 mg bid but continues to feel more fatigued - no significant bleeding on Eliquis, has occasional scratches but no prolonged bleeding - has to stay off eliquis in the near future 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 cholecalciferol (vitamin D3-125 mcg (5,000 unit)) (VITAMIN D3) 125 mcg, Daily (standard)  . ELIQUIS 5 mg, 2 times a day (standard)  . metoPROLOL  succinate (TOPROL -XL) 75 mg, Oral, 2 times a day  . PARoxetine (PAXIL) 20 MG tablet TAKE 1 TABLET BY MOUTH EVERY DAY  . rosuvastatin (CRESTOR) 5 mg, Oral, Daily (standard)    ALLERGIES: No Known Allergies     PHYSICAL EXAM: There were no vitals filed for this visit. GEN: Awake, alert, NAD CV: Irregularly irregular rhythm, normal S1 and S2, no obvious MRG PULM: CTAB anteriorly and posteriorly, no increased WOB EXTREMITIES: WWP, no lower extremity edema SKIN: No obvious lower extremity rashes or ulcers. NEUROLOGIC: Alert, interactive, and appropriate, grossly moving all 4 extremities.     OBJECTIVE DATA   Lab Results  Component Value Date   WBC 8.0 09/23/2023   HGB 12.5 09/23/2023   HCT 40.2 09/23/2023   PLT 258 09/23/2023    Lab Results  Component Value Date   NA 138 09/23/2023   K 4.7 09/23/2023   CL 101 09/23/2023   CO2 24.0 09/23/2023  BUN 16 09/23/2023   CREATININE 0.86 09/23/2023   GLU 78 09/23/2023   CALCIUM 9.3 09/23/2023    Lab Results  Component Value Date   BILITOT 0.6 07/17/2022   PROT 6.7 07/17/2022   ALBUMIN 2.8 (L) 05/19/2018   ALT 20 07/17/2022   AST 27 07/17/2022   ALKPHOS 104 07/17/2022     CARDIODIAGNOSTICS  ECG 10/19/2023:  Afib     TTE 04/15/2022:   Summary 1. The left ventricle is normal in size with normal wall thickness. 2. The left ventricular systolic function is normal, LVEF is visually estimated at > 55%. 3. There is mild mitral valve regurgitation. 4. The left atrium is severely dilated in size. 5. The right ventricle is normal in size, with normal systolic function. 6. The right atrium is severely dilated in size.  ZioPatch 03/30/2023:  Conclusions: - Ambulatory  ECG monitoring was performed from 03/30/23 to 04/13/23. - The predominant rhythm was atrial fibrillation (100% burden), with the rate ranging from 62 to 165 and averaging 103 bpm - No supraventricular ectopics (PACs) were recorded - Rare ventricular ectopics (PVCs) were recorded with no recorded episodes of wide-complex tachycardia - No patient-initiated recordings/events were submitted. - No pauses >3 seconds detected   RECOMMENDATIONS  HTN Bp 155/81 today Will add losartan 25 mg every day   HLD Taking rosuvastatin 5 mg once daily  Afib s/p DCCV in 2020 - now in persistent afib Continue metoprolol  succinate 75 mg twice daily Continue eliquis 5 mg twice daily, no bleeding events S/p ablation 8/25 @ [facility]   Follow-up in 6 months   Please contact us  for any questions.      Ozell Stands, DO  I personally spent 20 minutes face-to-face and non-face-to-face in the care of this patient, which includes all pre, intra, and post visit time on the date of service.  All documented time was specific to the E/M visit and does not include any procedures that may have been performed.

## 2023-10-26 ENCOUNTER — Ambulatory Visit (HOSPITAL_COMMUNITY)
Admission: RE | Admit: 2023-10-26 | Discharge: 2023-10-26 | Disposition: A | Discharge status: Home / Self Care | Source: Ambulatory Visit | Attending: Internal Medicine | Admitting: Internal Medicine

## 2023-10-26 VITALS — BP 134/90 | HR 107 | Ht 64.0 in | Wt 133.6 lb

## 2023-10-26 DIAGNOSIS — I4819 Other persistent atrial fibrillation: Secondary | ICD-10-CM | POA: Diagnosis not present

## 2023-10-26 DIAGNOSIS — I4891 Unspecified atrial fibrillation: Secondary | ICD-10-CM

## 2023-10-26 DIAGNOSIS — D6869 Other thrombophilia: Secondary | ICD-10-CM | POA: Diagnosis not present

## 2023-10-26 NOTE — Patient Instructions (Addendum)
 Cardioversion scheduled for: call us  to make a date    - Arrive at the [employer] [facility] ([address])  and check in with ADMITTING    - Do not eat or drink anything after midnight the night prior to your procedure.   - Take all your morning medication (except diabetic medications) with a sip of water prior to arrival.  - Do NOT miss any doses of your blood thinner - if you should miss a dose or take a dose more than 4 hours late -- please notify our office immediately.  - You will not be able to drive home after your procedure. Please ensure you have a responsible adult to drive you home. You will need someone with you for 24 hours post procedure.     - Expect to be in the procedural area approximately 2 hours.   - If you feel as if you go back into normal rhythm prior to scheduled cardioversion, please notify our office immediately.   If your procedure is canceled in the cardioversion suite you will be charged a cancellation fee.

## 2023-10-26 NOTE — H&P (View-Only) (Signed)
 Primary Care Physician: Anna Anna NOVAK, MD Primary Cardiologist: None Electrophysiologist: None     Referring Physician: Dr. Nancey Ronal Anna Silva is a 83 y.o. female with a history of HTN and persistent atrial fibrillation who presents for consultation in the [facility]. Patient is on Eliquis 5 mg BID for a CHADS2VASC score of 4.  On evaluation today, patient is currently in Afib. S/p Afib ablation on 09/29/23 by Dr. Nancey. Review of outside notes show patient was in Afib at office visit with [facility] cardiologist on 9/15. No chest pain or SOB. Leg sites healed without issue. No missed doses of anticoagulant.  Today, she denies symptoms of orthopnea, PND, lower extremity edema, dizziness, presyncope, syncope, snoring, daytime somnolence, bleeding, or neurologic sequela. The patient is tolerating medications without difficulties and is otherwise without complaint today.    she has a BMI of Body mass index is 22.93 kg/m.[name] Filed Weights   10/26/23 1449  Weight: 60.6 kg    Current Outpatient Medications  Medication Sig Dispense Refill   acetaminophen  (TYLENOL ) 500 MG tablet Take 1,000 mg by mouth every 6 (six) hours as needed for mild pain (pain score 1-3) or moderate pain (pain score 4-6). (Patient taking differently: Take 1,000 mg by mouth as needed for mild pain (pain score 1-3) or moderate pain (pain score 4-6).)     Cholecalciferol 25 MCG (1000 UT) tablet Take 1,000 Units by mouth daily.     ELIQUIS 5 MG TABS tablet Take 5 mg by mouth 2 (two) times daily.     ketotifen (ZADITOR) 0.035 % ophthalmic solution Place 1 drop into both eyes daily as needed (Dry eyes).     metoprolol  succinate (TOPROL -XL) 50 MG 24 hr tablet Take 1 tablet (50 mg total) by mouth daily. (Patient taking differently: Take 75 mg by mouth 2 (two) times daily.)     PARoxetine (PAXIL) 20 MG tablet Take 20 mg by mouth daily.     Polyethyl Glycol-Propyl Glycol (SYSTANE) 0.4-0.3 % SOLN  Place 1 drop into both eyes daily as needed (Dry eyes).     rosuvastatin (CRESTOR) 5 MG tablet Take 5 mg by mouth daily.     No current facility-administered medications for this encounter.    Atrial Fibrillation Management history:  Previous antiarrhythmic drugs:  Previous cardioversions: 2020 Previous ablations: 09/29/23 Anticoagulation history: Eliquis   ROS- All systems are reviewed and negative except as per the HPI above.  Physical Exam: BP (!) 134/90   Pulse (!) 107   Ht 5' 4 (1.626 m)   Wt 60.6 kg   BMI 22.93 kg/m   GEN: Well nourished, well developed in no acute distress NECK: No JVD; No carotid bruits CARDIAC: Irregularly irregular rate and rhythm, no murmurs, rubs, gallops RESPIRATORY:  Clear to auscultation without rales, wheezing or rhonchi  ABDOMEN: Soft, non-tender, non-distended EXTREMITIES:  No edema; No deformity   EKG today demonstrates  Vent. rate 107 BPM PR interval * ms QRS duration 80 ms QT/QTcB 346/461 ms P-R-T axes * 50 13 Atrial fibrillation with rapid ventricular response Abnormal ECG When compared with ECG of 29-Sep-2023 11:49, Atrial fibrillation has replaced Sinus rhythm  Echo 04/15/22 (outside study) demonstrated  Summary   1. The left ventricle is normal in size with normal wall thickness.    2. The left ventricular systolic function is normal, LVEF is visually  estimated at > 55%.    3. There is mild mitral valve regurgitation.  4. The left atrium is severely dilated in size.    5. The right ventricle is normal in size, with normal systolic function.    6. The right atrium is severely dilated in size.   ASSESSMENT & PLAN CHA2DS2-VASc Score = 4  The patient's score is based upon: CHF History: 0 HTN History: 1 Diabetes History: 0 Stroke History: 0 Vascular Disease History: 0 Age Score: 2 Gender Score: 1      ASSESSMENT AND PLAN: Persistent Atrial Fibrillation (ICD10:  I48.19) The patient's CHA2DS2-VASc score is 4,  indicating a 4.8% annual risk of stroke.   S/p Afib ablation on 09/29/23 by Dr. Nancey.  Patient is currently in Afib. We discussed the procedure cardioversion to try to convert to NSR. We discussed the risks vs benefits of this procedure and how ultimately we cannot predict whether a patient will have early return of arrhythmia post procedure. After discussion, the patient wishes to proceed with cardioversion. Labs drawn today. Will reassess blood pressure once patient is in normal rhythm and help confirm whether should begin losartan 25 mg daily prescribed by outside cardiologist. Patient has not started medication yet.   Informed Consent   Shared Decision Making/Informed Consent The risks (stroke, cardiac arrhythmias rarely resulting in the need for a temporary or permanent pacemaker, skin irritation or burns and complications associated with conscious sedation including aspiration, arrhythmia, respiratory failure and death), benefits (restoration of normal sinus rhythm) and alternatives of a direct current cardioversion were explained in detail to Anna Silva and she agrees to proceed.       Secondary Hypercoagulable State (ICD10:  D68.69) The patient is at significant risk for stroke/thromboembolism based upon her CHA2DS2-VASc Score of 4.  Continue Apixaban (Eliquis).  No missed doses.     Follow up 2 weeks after DCCV.    [name], [facility]  Afib Clinic [address] [place], [place] [address] [phone]

## 2023-10-26 NOTE — Progress Notes (Signed)
 Primary Care Physician: Anna Leta NOVAK, MD Primary Cardiologist: None Electrophysiologist: None     Referring Physician: Dr. Nancey Ronal ORN Silva is a 83 y.o. female with a history of HTN and persistent atrial fibrillation who presents for consultation in the [facility]. Patient is on Eliquis 5 mg BID for a CHADS2VASC score of 4.  On evaluation today, patient is currently in Afib. S/p Afib ablation on 09/29/23 by Dr. Nancey. Review of outside notes show patient was in Afib at office visit with [facility] cardiologist on 9/15. No chest pain or SOB. Leg sites healed without issue. No missed doses of anticoagulant.  Today, she denies symptoms of orthopnea, PND, lower extremity edema, dizziness, presyncope, syncope, snoring, daytime somnolence, bleeding, or neurologic sequela. The patient is tolerating medications without difficulties and is otherwise without complaint today.    she has a BMI of Body mass index is 22.93 kg/m.Anna Silva Filed Weights   10/26/23 1449  Weight: 60.6 kg    Current Outpatient Medications  Medication Sig Dispense Refill   acetaminophen  (TYLENOL ) 500 MG tablet Take 1,000 mg by mouth every 6 (six) hours as needed for mild pain (pain score 1-3) or moderate pain (pain score 4-6). (Patient taking differently: Take 1,000 mg by mouth as needed for mild pain (pain score 1-3) or moderate pain (pain score 4-6).)     Cholecalciferol 25 MCG (1000 UT) tablet Take 1,000 Units by mouth daily.     ELIQUIS 5 MG TABS tablet Take 5 mg by mouth 2 (two) times daily.     ketotifen (ZADITOR) 0.035 % ophthalmic solution Place 1 drop into both eyes daily as needed (Dry eyes).     metoprolol  succinate (TOPROL -XL) 50 MG 24 hr tablet Take 1 tablet (50 mg total) by mouth daily. (Patient taking differently: Take 75 mg by mouth 2 (two) times daily.)     PARoxetine (PAXIL) 20 MG tablet Take 20 mg by mouth daily.     Polyethyl Glycol-Propyl Glycol (SYSTANE) 0.4-0.3 % SOLN  Place 1 drop into both eyes daily as needed (Dry eyes).     rosuvastatin (CRESTOR) 5 MG tablet Take 5 mg by mouth daily.     No current facility-administered medications for this encounter.    Atrial Fibrillation Management history:  Previous antiarrhythmic drugs:  Previous cardioversions: 2020 Previous ablations: 09/29/23 Anticoagulation history: Eliquis   ROS- All systems are reviewed and negative except as per the HPI above.  Physical Exam: BP (!) 134/90   Pulse (!) 107   Ht 5' 4 (1.626 m)   Wt 60.6 kg   BMI 22.93 kg/m   GEN: Well nourished, well developed in no acute distress NECK: No JVD; No carotid bruits CARDIAC: Irregularly irregular rate and rhythm, no murmurs, rubs, gallops RESPIRATORY:  Clear to auscultation without rales, wheezing or rhonchi  ABDOMEN: Soft, non-tender, non-distended EXTREMITIES:  No edema; No deformity   EKG today demonstrates  Vent. rate 107 BPM PR interval * ms QRS duration 80 ms QT/QTcB 346/461 ms P-R-T axes * 50 13 Atrial fibrillation with rapid ventricular response Abnormal ECG When compared with ECG of 29-Sep-2023 11:49, Atrial fibrillation has replaced Sinus rhythm  Echo 04/15/22 (outside study) demonstrated  Summary   1. The left ventricle is normal in size with normal wall thickness.    2. The left ventricular systolic function is normal, LVEF is visually  estimated at > 55%.    3. There is mild mitral valve regurgitation.  4. The left atrium is severely dilated in size.    5. The right ventricle is normal in size, with normal systolic function.    6. The right atrium is severely dilated in size.   ASSESSMENT & PLAN CHA2DS2-VASc Score = 4  The patient's score is based upon: CHF History: 0 HTN History: 1 Diabetes History: 0 Stroke History: 0 Vascular Disease History: 0 Age Score: 2 Gender Score: 1      ASSESSMENT AND PLAN: Persistent Atrial Fibrillation (ICD10:  I48.19) The patient's CHA2DS2-VASc score is 4,  indicating a 4.8% annual risk of stroke.   S/p Afib ablation on 09/29/23 by Dr. Nancey.  Patient is currently in Afib. We discussed the procedure cardioversion to try to convert to NSR. We discussed the risks vs benefits of this procedure and how ultimately we cannot predict whether a patient will have early return of arrhythmia post procedure. After discussion, the patient wishes to proceed with cardioversion. Labs drawn today. Will reassess blood pressure once patient is in normal rhythm and help confirm whether should begin losartan 25 mg daily prescribed by outside cardiologist. Patient has not started medication yet.   Informed Consent   Shared Decision Making/Informed Consent The risks (stroke, cardiac arrhythmias rarely resulting in the need for a temporary or permanent pacemaker, skin irritation or burns and complications associated with conscious sedation including aspiration, arrhythmia, respiratory failure and death), benefits (restoration of normal sinus rhythm) and alternatives of a direct current cardioversion were explained in detail to Anna Silva and she agrees to proceed.       Secondary Hypercoagulable State (ICD10:  D68.69) The patient is at significant risk for stroke/thromboembolism based upon her CHA2DS2-VASc Score of 4.  Continue Apixaban (Eliquis).  No missed doses.     Follow up 2 weeks after DCCV.    Anna Silva, [facility]  Afib Clinic [address] [place], [place] [address] [phone]

## 2023-10-27 ENCOUNTER — Ambulatory Visit (HOSPITAL_COMMUNITY): Payer: Self-pay | Admitting: Internal Medicine

## 2023-10-27 LAB — CBC
Hematocrit: 39.4 % (ref 34.0–46.6)
Hemoglobin: 12.7 g/dL (ref 11.1–15.9)
MCH: 29.4 pg (ref 26.6–33.0)
MCHC: 32.2 g/dL (ref 31.5–35.7)
MCV: 91 fL (ref 79–97)
Platelets: 269 x10E3/uL (ref 150–450)
RBC: 4.32 x10E6/uL (ref 3.77–5.28)
RDW: 13.2 % (ref 11.7–15.4)
WBC: 8.9 x10E3/uL (ref 3.4–10.8)

## 2023-10-27 LAB — BASIC METABOLIC PANEL WITH GFR
BUN/Creatinine Ratio: 16 (ref 12–28)
BUN: 14 mg/dL (ref 8–27)
CO2: 22 mmol/L (ref 20–29)
Calcium: 9.4 mg/dL (ref 8.7–10.3)
Chloride: 100 mmol/L (ref 96–106)
Creatinine, Ser: 0.88 mg/dL (ref 0.57–1.00)
Glucose: 76 mg/dL (ref 70–99)
Potassium: 4.8 mmol/L (ref 3.5–5.2)
Sodium: 138 mmol/L (ref 134–144)
eGFR: 66 mL/min/1.73 (ref >59)

## 2023-10-27 NOTE — Addendum Note (Signed)
 Encounter addended by: Janel Nancy SAUNDERS, RN on: 10/27/2023 11:20 AM  Actions taken: Order list changed, Diagnosis association updated

## 2023-11-03 NOTE — Progress Notes (Signed)
 Pt called for pre procedure instructions. Arrival time 1000 NPO after midnight explained Instructed to take am meds with sip of water and confirmed blood thinner consistency. Instructed pt need for ride home tomorrow and have responsible adult with them for 24 hrs post procedure.

## 2023-11-04 ENCOUNTER — Encounter (HOSPITAL_COMMUNITY)
Admission: RE | Disposition: A | Discharge status: Home / Self Care | Payer: Self-pay | Source: Home / Self Care | Attending: Cardiology

## 2023-11-04 ENCOUNTER — Ambulatory Visit (HOSPITAL_COMMUNITY): Admitting: Registered Nurse

## 2023-11-04 ENCOUNTER — Ambulatory Visit (HOSPITAL_COMMUNITY)
Admission: RE | Admit: 2023-11-04 | Discharge: 2023-11-04 | Disposition: A | Discharge status: Home / Self Care | Attending: Cardiology | Admitting: Cardiology

## 2023-11-04 ENCOUNTER — Other Ambulatory Visit: Payer: Self-pay

## 2023-11-04 DIAGNOSIS — I4819 Other persistent atrial fibrillation: Secondary | ICD-10-CM | POA: Insufficient documentation

## 2023-11-04 DIAGNOSIS — I10 Essential (primary) hypertension: Secondary | ICD-10-CM | POA: Insufficient documentation

## 2023-11-04 DIAGNOSIS — I4891 Unspecified atrial fibrillation: Secondary | ICD-10-CM

## 2023-11-04 DIAGNOSIS — Z7901 Long term (current) use of anticoagulants: Secondary | ICD-10-CM | POA: Insufficient documentation

## 2023-11-04 DIAGNOSIS — Z87891 Personal history of nicotine dependence: Secondary | ICD-10-CM | POA: Diagnosis not present

## 2023-11-04 HISTORY — PX: CARDIOVERSION: EP1203

## 2023-11-04 SURGERY — CARDIOVERSION (CATH LAB)
Anesthesia: General

## 2023-11-04 MED ORDER — LIDOCAINE 2% (20 MG/ML) 5 ML SYRINGE
INTRAMUSCULAR | Status: DC | PRN
  Administered 2023-11-04: 50 mg via INTRAVENOUS

## 2023-11-04 MED ORDER — PROPOFOL 10 MG/ML IV BOLUS
INTRAVENOUS | Status: DC | PRN
Start: 2023-11-04 — End: 2023-11-04
  Administered 2023-11-04: 50 mg via INTRAVENOUS

## 2023-11-04 MED ORDER — SODIUM CHLORIDE 0.9 % IV SOLN: INTRAVENOUS | Status: DC

## 2023-11-04 SURGICAL SUPPLY — 1 items: PAD DEFIB RADIO PHYSIO CONN (PAD) ×1 IMPLANT

## 2023-11-04 NOTE — CV Procedure (Signed)
    Electrical Cardioversion Procedure Note Anna Silva [id number] 08-01-1940  Procedure: Electrical Cardioversion Indications:  Atrial Fibrillation  Time Out: Verified patient identification, verified procedure,medications/allergies/relevent history reviewed, required imaging and test results available.  Performed  Procedure Details  The patient was NPO after midnight. Anesthesia was administered at the beside  by Dr.Stoltzfus with 50mg  of propofol .  Cardioversion was performed with synchronized biphasic defibrillation via AP pads with 200 joules.  1 attempt(s) were performed.  The patient converted to normal sinus rhythm. The patient tolerated the procedure well   IMPRESSION:  Successful cardioversion of atrial fibrillation. Initially sinus bradycardia.     Anna Silva 11/04/2023, 11:31 AM

## 2023-11-04 NOTE — Anesthesia Postprocedure Evaluation (Signed)
 Anesthesia Post Note  Patient: Anna Silva  Procedure(s) Performed: CARDIOVERSION     Patient location during evaluation: PACU Anesthesia Type: General Level of consciousness: awake and alert Pain management: pain level controlled Vital Signs Assessment: post-procedure vital signs reviewed and stable Respiratory status: spontaneous breathing, nonlabored ventilation, respiratory function stable and patient connected to nasal cannula oxygen  Cardiovascular status: blood pressure returned to baseline and stable Postop Assessment: no apparent nausea or vomiting Anesthetic complications: no   There were no known notable events for this encounter.  Last Vitals:  Vitals:   11/04/23 1207 11/04/23 1215  BP:  (!) 159/78  Pulse:  (!) 41  Resp:  (!) 26  Temp: 36.6 C   SpO2:  96%    Last Pain:  Vitals:   11/04/23 1207  TempSrc: Temporal  PainSc:                  Anna Silva [name]

## 2023-11-04 NOTE — Anesthesia Preprocedure Evaluation (Signed)
 Anesthesia Evaluation  Patient identified by MRN, date of birth, ID band Patient awake    Reviewed: Allergy & Precautions, NPO status , Patient's Chart, lab work & pertinent test results  Airway Mallampati: II  TM Distance: >3 FB Neck ROM: Full    Dental no notable dental hx.    Pulmonary former smoker   Pulmonary exam normal        Cardiovascular hypertension,  Rhythm:Irregular Rate:Normal     Neuro/Psych    GI/Hepatic   Endo/Other    Renal/GU      Musculoskeletal   Abdominal Normal abdominal exam  (+)   Peds  Hematology   Anesthesia Other Findings   Reproductive/Obstetrics                              Anesthesia Physical Anesthesia Plan  ASA: 3  Anesthesia Plan: General   Post-op Pain Management:    Induction: Intravenous  PONV Risk Score and Plan: 3 and Treatment may vary due to age or medical condition  Airway Management Planned: Mask  Additional Equipment: None  Intra-op Plan:   Post-operative Plan:   Informed Consent: I have reviewed the patients History and Physical, chart, labs and discussed the procedure including the risks, benefits and alternatives for the proposed anesthesia with the patient or authorized representative who has indicated his/her understanding and acceptance.     Dental advisory given  Plan Discussed with: CRNA  Anesthesia Plan Comments:         Anesthesia Quick Evaluation

## 2023-11-04 NOTE — Interval H&P Note (Signed)
 History and Physical Interval Note:  11/04/2023 11:03 AM  Anna Silva  has presented today for surgery, with the diagnosis of afib.  The various methods of treatment have been discussed with the patient and family. After consideration of risks, benefits and other options for treatment, the patient has consented to  Procedure(s): CARDIOVERSION (N/A) as a surgical intervention.  The patient's history has been reviewed, patient examined, no change in status, stable for surgery.  I have reviewed the patient's chart and labs.  Questions were answered to the patient's satisfaction.     [employer]

## 2023-11-04 NOTE — Transfer of Care (Signed)
 Immediate Anesthesia Transfer of Care Note  Patient: Anna Silva  Procedure(s) Performed: CARDIOVERSION  Patient Location: Cath Lab  Anesthesia Type:General  Level of Consciousness: drowsy and responds to stimulation  Airway & Oxygen  Therapy: Patient Spontanous Breathing and Patient connected to nasal cannula oxygen   Post-op Assessment: Report given to RN and Post -op Vital signs reviewed and stable  Post vital signs: Reviewed and stable  Last Vitals:  Vitals Value Taken Time  BP 118/60   Temp    Pulse 40   Resp 15   SpO2 100     Last Pain:  Vitals:   11/04/23 0940  TempSrc: Temporal         Complications: There were no known notable events for this encounter.

## 2023-11-05 ENCOUNTER — Encounter (HOSPITAL_COMMUNITY): Payer: Self-pay | Admitting: Cardiology

## 2023-11-18 ENCOUNTER — Ambulatory Visit (HOSPITAL_COMMUNITY)
Admission: RE | Admit: 2023-11-18 | Discharge: 2023-11-18 | Disposition: A | Discharge status: Home / Self Care | Source: Ambulatory Visit | Attending: Internal Medicine | Admitting: Internal Medicine

## 2023-11-18 ENCOUNTER — Encounter (HOSPITAL_COMMUNITY): Payer: Self-pay | Admitting: Internal Medicine

## 2023-11-18 VITALS — BP 138/100 | HR 130 | Ht 64.0 in | Wt 135.6 lb

## 2023-11-18 DIAGNOSIS — D6869 Other thrombophilia: Secondary | ICD-10-CM | POA: Diagnosis not present

## 2023-11-18 DIAGNOSIS — I4819 Other persistent atrial fibrillation: Secondary | ICD-10-CM | POA: Diagnosis not present

## 2023-11-18 MED ORDER — AMIODARONE HCL 200 MG PO TABS: ORAL_TABLET | ORAL | 3 refills | Status: DC

## 2023-11-18 NOTE — Progress Notes (Signed)
 Primary Care Physician: Rosamond Leta NOVAK, MD Primary Cardiologist: None Electrophysiologist: None     Referring Physician: Dr. Nancey Ronal ORN Jantz is a 83 y.o. female with a history of HTN and persistent atrial fibrillation who presents for consultation in the [facility]. Patient is on Eliquis 5 mg BID for a CHADS2VASC score of 4.  On evaluation today, patient is currently in Afib. S/p Afib ablation on 09/29/23 by Dr. Nancey. Review of outside notes show patient was in Afib at office visit with [facility] cardiologist on 9/15. No chest pain or SOB. Leg sites healed without issue. No missed doses of anticoagulant.  On follow up 11/18/23, patient is currently in Afib with RVR. S/p DCCV on 10/1. No missed doses of Eliquis.   Today, she denies symptoms of orthopnea, PND, lower extremity edema, dizziness, presyncope, syncope, snoring, daytime somnolence, bleeding, or neurologic sequela. The patient is tolerating medications without difficulties and is otherwise without complaint today.    she has a BMI of Body mass index is 23.28 kg/m.[name] Filed Weights   11/18/23 1312  Weight: 61.5 kg     Current Outpatient Medications  Medication Sig Dispense Refill   acetaminophen  (TYLENOL ) 500 MG tablet Take 1,000 mg by mouth every 6 (six) hours as needed for mild pain (pain score 1-3) or moderate pain (pain score 4-6).     amoxicillin (AMOXIL) 500 MG capsule Take 500 mg by mouth 3 (three) times daily.     Cholecalciferol 25 MCG (1000 UT) tablet Take 1,000 Units by mouth daily.     ELIQUIS 5 MG TABS tablet Take 5 mg by mouth 2 (two) times daily.     losartan (COZAAR) 25 MG tablet Take 25 mg by mouth daily.     PARoxetine (PAXIL) 20 MG tablet Take 20 mg by mouth daily.     Polyethyl Glycol-Propyl Glycol (SYSTANE) 0.4-0.3 % SOLN Place 1 drop into both eyes daily as needed (Dry eyes).     rosuvastatin (CRESTOR) 5 MG tablet Take 5 mg by mouth daily.     No current  facility-administered medications for this encounter.    Atrial Fibrillation Management history:  Previous antiarrhythmic drugs:  Previous cardioversions: 2020, 11/04/23 Previous ablations: 09/29/23 Anticoagulation history: Eliquis   ROS- All systems are reviewed and negative except as per the HPI above.  Physical Exam: BP (!) 138/100   Pulse (!) 130   Ht 5' 4 (1.626 m)   Wt 61.5 kg   BMI 23.28 kg/m   GEN- The patient is well appearing, alert and oriented x 3 today.   Neck - no JVD or carotid bruit noted Lungs- Clear to ausculation bilaterally, normal work of breathing Heart- Irregular tachycardic rate and rhythm, no murmurs, rubs or gallops, PMI not laterally displaced Extremities- no clubbing, cyanosis, or edema Skin - no rash or ecchymosis noted   EKG today demonstrates  Vent. rate 130 BPM PR interval * ms QRS duration 80 ms QT/QTcB 298/438 ms P-R-T axes * 51 8 Atrial fibrillation with rapid ventricular response Abnormal ECG When compared with ECG of 04-Nov-2023 11:35, Atrial fibrillation has replaced Sinus rhythm  Echo 04/15/22 (outside study) demonstrated  Summary   1. The left ventricle is normal in size with normal wall thickness.    2. The left ventricular systolic function is normal, LVEF is visually  estimated at > 55%.    3. There is mild mitral valve regurgitation.    4. The left atrium  is severely dilated in size.    5. The right ventricle is normal in size, with normal systolic function.    6. The right atrium is severely dilated in size.   ASSESSMENT & PLAN CHA2DS2-VASc Score = 4  The patient's score is based upon: CHF History: 0 HTN History: 1 Diabetes History: 0 Stroke History: 0 Vascular Disease History: 0 Age Score: 2 Gender Score: 1      ASSESSMENT AND PLAN: Persistent Atrial Fibrillation (ICD10:  I48.19) The patient's CHA2DS2-VASc score is 4, indicating a 4.8% annual risk of stroke.   S/p Afib ablation on 09/29/23 by Dr. Nancey. S/p  DCCV on 11/04/23.  Patient is currently in Afib with RVR She has unfortunately had ERAF despite successful cardioversion. We discussed AAD therapy in the blanking period and after discussion will being amiodarone 200 mg BID x 1 month then transition to amiodarone 200 mg once daily. There is a class C interaction with Paxil so will reassess patient in 3 weeks with ECG to confirm no QT prolongation. I did discuss with patient there is a chance that taking medication can elicit chemical conversion; in the event it does not we would likely repeat cardioversion. Patient made aware of plan and agrees to proceed.   Secondary Hypercoagulable State (ICD10:  D68.69) The patient is at significant risk for stroke/thromboembolism based upon her CHA2DS2-VASc Score of 4.  Continue Apixaban (Eliquis).  No missed doses.    Repeat ECG 3 weeks.    [name], [facility]  Afib Clinic [address] [place], [place] [address] [phone]

## 2023-11-18 NOTE — Patient Instructions (Addendum)
 Start Amiodarone 200mg  twice a day for 30 days (11/15) then reduce to 200mg  once a day Take with food

## 2023-12-09 ENCOUNTER — Ambulatory Visit (HOSPITAL_COMMUNITY)
Admission: RE | Admit: 2023-12-09 | Discharge: 2023-12-09 | Disposition: A | Discharge status: Home / Self Care | Source: Ambulatory Visit | Attending: Internal Medicine | Admitting: Internal Medicine

## 2023-12-09 VITALS — HR 92

## 2023-12-09 DIAGNOSIS — I4891 Unspecified atrial fibrillation: Secondary | ICD-10-CM | POA: Diagnosis not present

## 2023-12-09 DIAGNOSIS — I4819 Other persistent atrial fibrillation: Secondary | ICD-10-CM

## 2023-12-09 MED ORDER — AMIODARONE HCL 200 MG PO TABS: 200.0000 mg | ORAL_TABLET | Freq: Every day | ORAL | 3 refills | Status: DC

## 2023-12-09 NOTE — H&P (View-Only) (Signed)
 Patient began amiodarone load on 10/15. She feels foggy-headed since starting medication and is still taking 200 mg BID. She missed a dose of Eliquis two weeks ago.   ECG today shows  Vent. rate 92 BPM PR interval * ms QRS duration 82 ms QT/QTcB 390/482 ms P-R-T axes * 42 41 Atrial fibrillation Abnormal ECG When compared with ECG of 18-Nov-2023 13:14, No significant change was found   We discussed the procedure cardioversion to try to convert to NSR. We discussed the risks vs benefits of this procedure and how ultimately we cannot predict whether a patient will have early return of arrhythmia post procedure. After discussion, the patient wishes to proceed with cardioversion. Labs drawn today. Decrease amiodarone to 200 mg once daily. Continue Eliquis without interruption.  Informed Consent   Shared Decision Making/Informed Consent The risks (stroke, cardiac arrhythmias rarely resulting in the need for a temporary or permanent pacemaker, skin irritation or burns and complications associated with conscious sedation including aspiration, arrhythmia, respiratory failure and death), benefits (restoration of normal sinus rhythm) and alternatives of a direct current cardioversion were explained in detail to Ms. Dignan and she agrees to proceed.        Follow up as scheduled with Dr. Nancey after DCCV.

## 2023-12-09 NOTE — Addendum Note (Signed)
 Encounter addended by: Janel Nancy SAUNDERS, RN on: 12/09/2023 3:50 PM  Actions taken: Order list changed, Diagnosis association updated

## 2023-12-09 NOTE — Progress Notes (Signed)
 Patient began amiodarone load on 10/15. She feels foggy-headed since starting medication and is still taking 200 mg BID. She missed a dose of Eliquis two weeks ago.   ECG today shows  Vent. rate 92 BPM PR interval * ms QRS duration 82 ms QT/QTcB 390/482 ms P-R-T axes * 42 41 Atrial fibrillation Abnormal ECG When compared with ECG of 18-Nov-2023 13:14, No significant change was found   We discussed the procedure cardioversion to try to convert to NSR. We discussed the risks vs benefits of this procedure and how ultimately we cannot predict whether a patient will have early return of arrhythmia post procedure. After discussion, the patient wishes to proceed with cardioversion. Labs drawn today. Decrease amiodarone to 200 mg once daily. Continue Eliquis without interruption.  Informed Consent   Shared Decision Making/Informed Consent The risks (stroke, cardiac arrhythmias rarely resulting in the need for a temporary or permanent pacemaker, skin irritation or burns and complications associated with conscious sedation including aspiration, arrhythmia, respiratory failure and death), benefits (restoration of normal sinus rhythm) and alternatives of a direct current cardioversion were explained in detail to Anna Silva and she agrees to proceed.        Follow up as scheduled with Dr. Nancey after DCCV.

## 2023-12-09 NOTE — Patient Instructions (Addendum)
 Cardioversion scheduled for: 12/22/23 Tuesday 7:00am    - Arrive at the [employer] [facility] ([address]  and check in with ADMITTING at 7:00 am    - Do not eat or drink anything after midnight the night prior to your procedure.   - Take all your morning medication (except diabetic medications) with a sip of water prior to arrival.  - Do NOT miss any doses of your blood thinner - if you should miss a dose or take a dose more than 4 hours late -- please notify our office immediately.  - You will not be able to drive home after your procedure. Please ensure you have a responsible adult to drive you home. You will need someone with you for 24 hours post procedure.     - Expect to be in the procedural area approximately 2 hours.   - If you feel as if you go back into normal rhythm prior to scheduled cardioversion, please notify our office immediately.   If your procedure is canceled in the cardioversion suite you will be charged a cancellation fee.

## 2023-12-10 ENCOUNTER — Ambulatory Visit (HOSPITAL_COMMUNITY): Payer: Self-pay | Admitting: Internal Medicine

## 2023-12-10 LAB — BASIC METABOLIC PANEL WITH GFR
BUN/Creatinine Ratio: 18 (ref 12–28)
BUN: 16 mg/dL (ref 8–27)
CO2: 24 mmol/L (ref 20–29)
Calcium: 9.1 mg/dL (ref 8.7–10.3)
Chloride: 99 mmol/L (ref 96–106)
Creatinine, Ser: 0.88 mg/dL (ref 0.57–1.00)
Glucose: 76 mg/dL (ref 70–99)
Potassium: 4.7 mmol/L (ref 3.5–5.2)
Sodium: 139 mmol/L (ref 134–144)
eGFR: 65 mL/min/1.73 (ref >59)

## 2023-12-10 LAB — CBC
Hematocrit: 42.4 % (ref 34.0–46.6)
Hemoglobin: 13.5 g/dL (ref 11.1–15.9)
MCH: 28.1 pg (ref 26.6–33.0)
MCHC: 31.8 g/dL (ref 31.5–35.7)
MCV: 88 fL (ref 79–97)
Platelets: 280 x10E3/uL (ref 150–450)
RBC: 4.8 x10E6/uL (ref 3.77–5.28)
RDW: 12.6 % (ref 11.7–15.4)
WBC: 8.7 x10E3/uL (ref 3.4–10.8)

## 2023-12-21 NOTE — Progress Notes (Signed)
 Pt called for pre procedure instructions. Arrival time 0715 NPO after midnight explained Instructed to take am meds with sip of water and confirmed blood thinner consistency Instructed pt need for ride home tomorrow and have responsible adult with them for 24 hrs post procedure.

## 2023-12-21 NOTE — Anesthesia Preprocedure Evaluation (Signed)
 Anesthesia Evaluation  Patient identified by MRN, date of birth, ID band Patient awake    Reviewed: Allergy & Precautions, H&P , NPO status , Patient's Chart, lab work & pertinent test results  History of Anesthesia Complications Negative for: history of anesthetic complications  Airway Mallampati: II  TM Distance: >3 FB Neck ROM: Full    Dental no notable dental hx. (+) Partial Lower   Pulmonary neg pulmonary ROS, former smoker   Pulmonary exam normal breath sounds clear to auscultation       Cardiovascular hypertension, (-) angina + dysrhythmias Atrial Fibrillation  Rhythm:Irregular Rate:Normal     Neuro/Psych neg Seizures negative neurological ROS  negative psych ROS   GI/Hepatic negative GI ROS, Neg liver ROS,,,  Endo/Other  negative endocrine ROS    Renal/GU negative Renal ROS  negative genitourinary   Musculoskeletal negative musculoskeletal ROS (+)    Abdominal   Peds negative pediatric ROS (+)  Hematology negative hematology ROS (+)   Anesthesia Other Findings   Reproductive/Obstetrics Right sided breast cancer                              Anesthesia Physical Anesthesia Plan  ASA: 3  Anesthesia Plan: General   Post-op Pain Management: Minimal or no pain anticipated   Induction: Intravenous  PONV Risk Score and Plan: 3 and Treatment may vary due to age or medical condition  Airway Management Planned: Natural Airway and Simple Face Mask  Additional Equipment: None  Intra-op Plan:   Post-operative Plan:   Informed Consent: I have reviewed the patients History and Physical, chart, labs and discussed the procedure including the risks, benefits and alternatives for the proposed anesthesia with the patient or authorized representative who has indicated his/her understanding and acceptance.     Dental advisory given  Plan Discussed with: CRNA  Anesthesia Plan  Comments:          Anesthesia Quick Evaluation

## 2023-12-22 ENCOUNTER — Ambulatory Visit (HOSPITAL_COMMUNITY)
Admission: RE | Admit: 2023-12-22 | Discharge: 2023-12-22 | Disposition: A | Discharge status: Home / Self Care | Attending: Cardiology | Admitting: Cardiology

## 2023-12-22 ENCOUNTER — Ambulatory Visit (HOSPITAL_BASED_OUTPATIENT_CLINIC_OR_DEPARTMENT_OTHER): Payer: Self-pay

## 2023-12-22 ENCOUNTER — Encounter (HOSPITAL_COMMUNITY): Payer: Self-pay | Admitting: Cardiology

## 2023-12-22 ENCOUNTER — Other Ambulatory Visit: Payer: Self-pay

## 2023-12-22 ENCOUNTER — Encounter (HOSPITAL_COMMUNITY)
Admission: RE | Disposition: A | Discharge status: Home / Self Care | Payer: Self-pay | Source: Home / Self Care | Attending: Cardiology

## 2023-12-22 ENCOUNTER — Ambulatory Visit (HOSPITAL_COMMUNITY): Payer: Self-pay

## 2023-12-22 DIAGNOSIS — Z87891 Personal history of nicotine dependence: Secondary | ICD-10-CM

## 2023-12-22 DIAGNOSIS — I4819 Other persistent atrial fibrillation: Secondary | ICD-10-CM | POA: Diagnosis not present

## 2023-12-22 DIAGNOSIS — I10 Essential (primary) hypertension: Secondary | ICD-10-CM | POA: Diagnosis not present

## 2023-12-22 DIAGNOSIS — I4891 Unspecified atrial fibrillation: Secondary | ICD-10-CM

## 2023-12-22 HISTORY — PX: CARDIOVERSION: EP1203

## 2023-12-22 SURGERY — CARDIOVERSION (CATH LAB)
Anesthesia: General

## 2023-12-22 MED ORDER — LIDOCAINE 2% (20 MG/ML) 5 ML SYRINGE
INTRAMUSCULAR | Status: DC | PRN
Start: 2023-12-22 — End: 2023-12-22
  Administered 2023-12-22: 60 mg via INTRAVENOUS

## 2023-12-22 MED ORDER — PROPOFOL 10 MG/ML IV BOLUS
INTRAVENOUS | Status: DC | PRN
  Administered 2023-12-22: 50 mg via INTRAVENOUS

## 2023-12-22 MED ORDER — SODIUM CHLORIDE 0.9 % IV SOLN: INTRAVENOUS | Status: DC

## 2023-12-22 SURGICAL SUPPLY — 1 items: PAD DEFIB RADIO PHYSIO CONN (PAD) ×1 IMPLANT

## 2023-12-22 NOTE — Discharge Instructions (Signed)

## 2023-12-22 NOTE — Interval H&P Note (Signed)
 History and Physical Interval Note:  12/22/2023 10:49 AM  Anna Silva  has presented today for surgery, with the diagnosis of AFIB.  The various methods of treatment have been discussed with the patient and family. After consideration of risks, benefits and other options for treatment, the patient has consented to  Procedure(s): CARDIOVERSION (N/A) as a surgical intervention.  The patient's history has been reviewed, patient examined, no change in status, stable for surgery.  I have reviewed the patient's chart and labs.  Questions were answered to the patient's satisfaction.     Wilbert Bihari

## 2023-12-22 NOTE — CV Procedure (Signed)
    Electrical Cardioversion Procedure Note Anna Silva [id number] 1940/02/29  Procedure: Electrical Cardioversion Indications:  Atrial Fibrillation  Time Out: Verified patient identification, verified procedure,medications/allergies/relevent history reviewed, required imaging and test results available.  Performed  Procedure Details  During this procedure the patient is administered a total of Propofol  50 mg and Lidocaine  60 mg to achieve and maintain moderate conscious sedation.  The patient's heart rate, blood pressure, and oxygen  saturation are monitored continuously during the procedure. The period of conscious sedation is 2 minutes, of which I was present face-to-face 100% of this time. Anna Schmitz, CRNA is an independent, trained observer who assisted in the monitoring of the patient's level of consciousness.     Cardioversion was done with synchronized biphasic defibrillation with AP pads with 200watts.  The patient converted to normal sinus rhythm. The patient tolerated the procedure well   IMPRESSION:  Successful cardioversion of atrial fibrillation    Anna Silva 12/22/2023, 8:05 AM

## 2023-12-22 NOTE — Anesthesia Postprocedure Evaluation (Signed)
 Anesthesia Post Note  Patient: Anna Silva  Procedure(s) Performed: CARDIOVERSION     Patient location during evaluation: PACU Anesthesia Type: General Level of consciousness: awake and alert Pain management: pain level controlled Vital Signs Assessment: post-procedure vital signs reviewed and stable Respiratory status: spontaneous breathing, nonlabored ventilation, respiratory function stable and patient connected to nasal cannula oxygen  Cardiovascular status: blood pressure returned to baseline and stable Postop Assessment: no apparent nausea or vomiting Anesthetic complications: no   No notable events documented.  Last Vitals:  Vitals:   12/22/23 0831 12/22/23 0841  BP: 134/61 (!) 144/70  Pulse: (!) 51 (!) 52  Resp: 16 17  Temp:    SpO2: 99% 95%    Last Pain:  Vitals:   12/22/23 0841  TempSrc:   PainSc: 0-No pain                 Thom JONELLE Peoples

## 2023-12-22 NOTE — Transfer of Care (Signed)
 Immediate Anesthesia Transfer of Care Note  Patient: Anna Silva  Procedure(s) Performed: CARDIOVERSION  Patient Location: PACU  Anesthesia Type:MAC  Level of Consciousness: drowsy  Airway & Oxygen  Therapy: Patient Spontanous Breathing and Patient connected to nasal cannula oxygen   Post-op Assessment: Report given to RN and Post -op Vital signs reviewed and stable  Post vital signs: Reviewed and stable  Last Vitals:  Vitals Value Taken Time  BP    Temp    Pulse    Resp    SpO2      Last Pain:  Vitals:   12/22/23 0714  TempSrc: Temporal  PainSc: 0-No pain         Complications: No notable events documented.

## 2023-12-23 ENCOUNTER — Encounter (HOSPITAL_COMMUNITY): Payer: Self-pay | Admitting: Cardiology

## 2024-01-06 ENCOUNTER — Telehealth: Payer: Self-pay | Admitting: Cardiovascular Disease

## 2024-01-06 ENCOUNTER — Encounter: Payer: Self-pay | Admitting: *Deleted

## 2024-01-06 NOTE — Telephone Encounter (Signed)
 Called received from [facility], Dr. Bari office [address]. Pt in to have tooth extraction. Last blood pressure 142/81. Initial 218/105, 198/95, 216/88. PT Denies dizziness or light headiness.  Pt was not able to have tooth extracted. Pt and  oral surgeon provider would like for pt to be seen today in office. Sent DOD 1 msg. Called dropped and clinical research associate tried to call pt and Transport Planner and left message to call office for recommendations and appt. Left message for pt to call office and go to Emergency Room

## 2024-01-06 NOTE — Telephone Encounter (Signed)
 Open in error

## 2024-01-06 NOTE — Telephone Encounter (Signed)
 Per documentation on another encounter:  Theotis Sharlet PARAS, RN 01/06/24  2:40 PM Note Pt sees Dr Ozell Stands, DO at [facility] for general cardiology and treatment of HTN.  Sees Dr Nancey for [facility] and recent ablation.  Pt is scheduled for f/u appt with Dr Nancey 01/08/24.  To Dr Jeffrie (DOD) for review and any further recommendations.      Left message for patient. Asked that she contact her general cardiologist with [facility]

## 2024-01-06 NOTE — Telephone Encounter (Signed)
 New message  Pt walked in stating that she was at her dentist office for an extraction and her bp was really high.  They advised her to come and see her cardiologist.  She is under the impression that an appt was made.  No appt.  Message sent to triage.

## 2024-01-06 NOTE — Telephone Encounter (Signed)
 Pt sees Dr Ozell Stands, DO at [facility] for general cardiology and treatment of HTN.  Sees Dr Nancey for At [facility] and recent ablation.  Pt is scheduled for f/u appt with Dr Nancey 01/08/24.  To Dr Jeffrie (DOD) for review and any further recommendations.

## 2024-01-06 NOTE — Telephone Encounter (Signed)
 Pt c/o BP issue: STAT if pt c/o blurred vision, one-sided weakness or slurred speech  1. What are your last 5 BP readings? 216/88 earlier and then 218/105 while in the office.   2. Are you having any other symptoms (ex. Dizziness, headache, blurred vision, passed out)? No  3. What is your BP issue? Isabella is calling regarding the pt coming in today to have her tooth extracted and her bp is on the higher side. Please advise

## 2024-01-08 ENCOUNTER — Telehealth (HOSPITAL_BASED_OUTPATIENT_CLINIC_OR_DEPARTMENT_OTHER): Payer: Self-pay

## 2024-01-08 ENCOUNTER — Ambulatory Visit: Admitting: Cardiovascular Disease

## 2024-01-08 NOTE — Telephone Encounter (Signed)
   Primary Cardiologist: None  Chart reviewed as part of pre-operative protocol coverage. Simple dental extractions (1-2 teeth) are considered low risk procedures per guidelines and generally do not require any specific cardiac clearance. It is also generally accepted that for simple extractions and dental cleanings, there is no need to interrupt blood thinner therapy.   Additionally, patient underwent cardioversion for atrial fibrillation on 12/22/23 and cannot hold Eliquis before 01/21/24.   Hypertension is managed by general cardiologist at [facility] and/or primary care provider.   SBE prophylaxis is not required for the patient.  I will route this recommendation to the requesting party via Epic fax function and remove from pre-op  pool.  Please call with questions.  Rosaline EMERSON Bane, NP-C 01/08/2024, 12:03 PM [address] [place], [place] [address] Office [phone] Fax [phone]

## 2024-01-08 NOTE — Telephone Encounter (Signed)
   Pre-operative Risk Assessment    Patient Name: Anna Silva  DOB: [birth year] MRN: [id number]   Date of last office visit: 08/10/2023 with Dr. Nancey Date of next office visit: 01/22/2024 with Daphne Barrack, NP  Request for Surgical Clearance    Procedure:  Dental Extraction - Amount of Teeth to be Pulled:  One surgical extraction   Date of Surgery:  Clearance TBD                                 Surgeon:  DrRONITA Sax Surgeon's Group or Practice Name:  [facility] Phone number:  [phone] Fax number:  [phone]   Type of Clearance Requested:   - Medical  - Pharmacy:  Hold Apixaban (Eliquis) -We do not suggest the pt hold any medications: this is up to the prescribing doctor    Type of Anesthesia:  Local    Additional requests/questions:  Was was seen at our dental office 01/06/24 and had an elevated BP. The surgery was aborted due to the patient's readings at our office. She has a f/u at cardiology 01/22/2024. Please send recent updated medical clearance pending the patient's appt.   PT HAD A RECENT AFIB ABLATION AND DCCV  Signed, Patrcia Iverson CROME   01/08/2024, 9:26 AM

## 2024-01-19 NOTE — Interval H&P Note (Signed)
 History and Physical Interval Note:  01/19/2024 10:33 PM  Ronal ORN Valentino  has presented today for surgery, with the diagnosis of AFIB.  The various methods of treatment have been discussed with the patient and family. After consideration of risks, benefits and other options for treatment, the patient has consented to  Procedures: CARDIOVERSION (N/A) as a surgical intervention.  The patient's history has been reviewed, patient examined, no change in status, stable for surgery.  I have reviewed the patient's chart and labs.  Questions were answered to the patient's satisfaction.     Wilbert Bihari

## 2024-01-21 NOTE — Progress Notes (Unsigned)
°  Electrophysiology Office Note:   Date:  01/21/2024  ID:  Anna Silva, DOB 02-24-1940, MRN [id number]  Primary Cardiologist: None Primary Heart Failure: None Electrophysiologist: None  {Click to update primary MD,subspecialty MD or APP then REFRESH:1}    History of Present Illness:   Anna Silva is a 83 y.o. female with h/o AF, HTN seen today for routine electrophysiology follow-up s/p AF Ablation.  Since last being seen in our clinic the patient reports doing ***.    She *** denies chest pain, palpitations, dyspnea, PND, orthopnea, nausea, vomiting, dizziness, syncope, edema, weight gain, or early satiety.    Review of systems complete and found to be negative unless listed in HPI.   EP Information / Studies Reviewed:    EKG is ordered today. Personal review as below.       Arrhythmia / AAD / Pertinent EP Studies AF DCCV 06/2018  EPS 09/29/23 > PVI + PW ablation  Back in AF 10/19/23  DCCV 10/125 Amiodarone  11/2023 >    Risk Assessment/Calculations:    CHA2DS2-VASc Score = 4  {Confirm score is correct.  If not, click here to update score.  REFRESH note.  :1} This indicates a 4.8% annual risk of stroke. The patient's score is based upon: CHF History: 0 HTN History: 1 Diabetes History: 0 Stroke History: 0 Vascular Disease History: 0 Age Score: 2 Gender Score: 1   {This patient has a significant risk of stroke if diagnosed with atrial fibrillation.  Please consider VKA or DOAC agent for anticoagulation if the bleeding risk is acceptable.   You can also use the SmartPhrase .[email or web address] for documentation.   :[id number]} No BP recorded.  {Refresh Note OR Click here to enter BP  :1}***        Physical Exam:   VS:  There were no vitals taken for this visit.   Wt Readings from Last 3 Encounters:  11/18/23 135 lb 9.6 oz (61.5 kg)  11/04/23 137 lb (62.1 kg)  10/26/23 133 lb 9.6 oz (60.6 kg)     GEN: Well nourished, well developed in no acute distress NECK: No JVD;  No carotid bruits CARDIAC: {EPRHYTHM:28826}, no murmurs, rubs, gallops RESPIRATORY:  Clear to auscultation without rales, wheezing or rhonchi  ABDOMEN: Soft, non-tender, non-distended EXTREMITIES:  No edema; No deformity   ASSESSMENT AND PLAN:    Persistent Atrial Fibrillation  High Risk Medication Monitoring: Amiodarone   CHA2DS2-VASc 4, s/p PVI + PW ablation 09/2023  -OAC for stroke prophylaxis  -EKG with ***, stable intervals  -amiodarone  200mg  daily *** continue ? *** -update amiodarone  labs > TSH, free T4, LFT's ***  Secondary Hypercoagulable State  -continue Eliquis 5mg  BID, dose reviewed and appropriate by wt / Cr  -follow wt closely, may be nearing point of needing to dose reduce   Follow up with EP APP {EPFOLLOW LE:71826}  Signed, Daphne Barrack, NP-C, AGACNP-BC [facility] - Electrophysiology  01/21/2024, 2:53 PM

## 2024-01-22 ENCOUNTER — Telehealth: Payer: Self-pay | Admitting: *Deleted

## 2024-01-22 ENCOUNTER — Encounter: Payer: Self-pay | Admitting: Pulmonary Disease

## 2024-01-22 ENCOUNTER — Ambulatory Visit: Attending: Pulmonary Disease | Admitting: Pulmonary Disease

## 2024-01-22 ENCOUNTER — Other Ambulatory Visit: Payer: Self-pay | Admitting: *Deleted

## 2024-01-22 VITALS — BP 144/84 | HR 93 | Ht 64.0 in | Wt 138.8 lb

## 2024-01-22 DIAGNOSIS — D6869 Other thrombophilia: Secondary | ICD-10-CM | POA: Diagnosis not present

## 2024-01-22 DIAGNOSIS — I4819 Other persistent atrial fibrillation: Secondary | ICD-10-CM

## 2024-01-22 DIAGNOSIS — Z79899 Other long term (current) drug therapy: Secondary | ICD-10-CM

## 2024-01-22 NOTE — Patient Instructions (Addendum)
 Medication Instructions:  No medications changes today   *If you need a refill on your cardiac medications before your next appointment, please call your pharmacy*  Lab Work: No lab work today If you have labs (blood work) drawn today and your tests are completely normal, you will receive your results only by: MyChart Message (if you have MyChart) OR A paper copy in the mail If you have any lab test that is abnormal or we need to change your treatment, we will call you to review the results.  Testing/Procedures: No testing/procedures were scheduled today  Follow-Up: At [facility], you and your health needs are our priority.  As part of our continuing mission to provide you with exceptional heart care, our providers are all part of one team.  This team includes your primary Cardiologist (physician) and Advanced Practice Providers or APPs (Physician Assistants and Nurse Practitioners) who all work together to provide you with the care you need, when you need it.  Your next appointment:   Follow up with Dr. Nancey at first appt available.  I will discuss your plan of care and will call you.

## 2024-01-22 NOTE — Telephone Encounter (Signed)
 Anna Silva saw pt today in clinic and she would like for her to have amiodarone  labs drawn. Orders have been placed/released for her to go to any [facility]. I attempted to reach pt but had to leave her a VM requesting that she return the call as she does not have a DPR on file for me to leave her a detailed msg.

## 2024-01-25 ENCOUNTER — Other Ambulatory Visit (HOSPITAL_COMMUNITY)
Admission: RE | Admit: 2024-01-25 | Discharge: 2024-01-25 | Disposition: A | Discharge status: Home / Self Care | Source: Ambulatory Visit | Attending: Pulmonary Disease | Admitting: Pulmonary Disease

## 2024-01-25 DIAGNOSIS — I4891 Unspecified atrial fibrillation: Secondary | ICD-10-CM | POA: Diagnosis present

## 2024-01-25 LAB — COMPREHENSIVE METABOLIC PANEL WITH GFR
ALT: 15 U/L (ref 0–44)
AST: 22 U/L (ref 15–41)
Albumin: 4.1 g/dL (ref 3.5–5.0)
Alkaline Phosphatase: 111 U/L (ref 38–126)
Anion gap: 12 (ref 5–15)
BUN: 16 mg/dL (ref 8–23)
CO2: 24 mmol/L (ref 22–32)
Calcium: 9.1 mg/dL (ref 8.9–10.3)
Chloride: 103 mmol/L (ref 98–111)
Creatinine, Ser: 0.85 mg/dL (ref 0.44–1.00)
GFR, Estimated: >60 mL/min
Glucose, Bld: 88 mg/dL (ref 70–99)
Potassium: 4.8 mmol/L (ref 3.5–5.1)
Sodium: 139 mmol/L (ref 135–145)
Total Bilirubin: 0.6 mg/dL (ref 0.0–1.2)
Total Protein: 6.9 g/dL (ref 6.5–8.1)

## 2024-01-25 LAB — TSH: TSH: 5.22 u[IU]/mL — ABNORMAL HIGH (ref 0.350–4.500)

## 2024-01-25 LAB — T4, FREE: Free T4: 1.35 ng/dL (ref 0.80–2.00)

## 2024-01-26 ENCOUNTER — Ambulatory Visit: Payer: Self-pay | Admitting: Pulmonary Disease

## 2024-01-26 ENCOUNTER — Encounter: Payer: Self-pay | Admitting: Cardiovascular Disease

## 2024-01-26 ENCOUNTER — Telehealth: Payer: Self-pay | Admitting: Pulmonary Disease

## 2024-01-26 ENCOUNTER — Ambulatory Visit: Admitting: Cardiovascular Disease

## 2024-01-26 VITALS — BP 153/99 | HR 104 | Ht 64.0 in | Wt 140.5 lb

## 2024-01-26 DIAGNOSIS — I4819 Other persistent atrial fibrillation: Secondary | ICD-10-CM | POA: Diagnosis not present

## 2024-01-26 NOTE — Progress Notes (Signed)
 " Electrophysiology Office Note:    Date:  01/26/2024   ID:  Anna Silva, DOB 1940-12-29, MRN [id number]  PCP:  Rosamond Leta NOVAK, MD    HeartCare Providers Cardiologist:  None Electrophysiologist:  Eulas FORBES Furbish, MD     Referring MD: Rosamond Leta NOVAK, MD   History of Present Illness:    Anna Silva is a 83 y.o. female with a medical history significant for atrial fibrillation, hypertension referred for arrhythmia management.      Discussed the use of AI scribe software for clinical note transcription with the patient, who gave verbal consent to proceed.  History of Present Illness Anna Silva is an 83 year old female with persistent atrial fibrillation who presents for arrhythmia management.  She has a history of persistent atrial fibrillation, initially diagnosed in April 2020 during a hospitalization for pneumonia. A cardioversion attempt was aborted at that time due to the presence of a clot, but a successful cardioversion was performed on Jun 18, 2018. However, she eventually returned to atrial fibrillation.  She experiences significant fatigue, which she initially attributed to her medication. She is currently on metoprolol , though the specific dose and frequency are not mentioned. A Ziopatch study conducted through [facility] indicated persistent atrial fibrillation with an average heart rate of about 100 beats per minute. No palpitations are reported, but fatigue remains her primary symptom.  Her past medical history includes a secondary hypercoagulable state. Family history is notable for her father, who died of a heart attack at age 81 and had atrial fibrillation at age 53, experiencing improvement after treatment.  She has expressed frustration with the frequent changes in her healthcare providers, having seen multiple doctors at [facility], including Dr. Brien, Dr. Leona, and Dr. Troy, whom she liked but who moved away.  Due to atrial fibrillation symptomatic with  fatigue, she underwent A-fib ablation in August 2025.  She had recurrence twice with cardioversions and was started on amiodarone .  Despite this atrial fibrillation has recurred.  She reports that she feels reasonably well though her energy levels are less than they would be in normal rhythm.         Today, she reports ongoing fatigue but is otherwise well.  EKGs/Labs/Other Studies Reviewed Today:     Echocardiogram:  TTE March 2024 [facility] EF greater than 55%.  Left atrium severely dilated.   Monitors:  03/30/2023 day monitor [facility]   100% AF burden with average heart rate 103 bpm   EKG:   EKG Interpretation Date/Time:  Tuesday January 26 2024 15:56:25 EST Ventricular Rate:  104 PR Interval:    QRS Duration:  86 QT Interval:  376 QTC Calculation: 494 R Axis:   65  Text Interpretation: Atrial fibrillation with rapid ventricular response When compared with ECG of 22-Jan-2024 13:22, No significant change was found Confirmed by Furbish Eulas [phone]) on 01/26/2024 4:13:08 PM     Physical Exam:    VS:  BP (!) 153/99 (BP Location: Left Arm, Patient Position: Sitting, Cuff Size: Normal)   Pulse (!) 104   Ht 5' 4 (1.626 m)   Wt 140 lb 8 oz (63.7 kg)   SpO2 98%   BMI 24.12 kg/m     Wt Readings from Last 3 Encounters:  01/26/24 140 lb 8 oz (63.7 kg)  01/22/24 138 lb 12.8 oz (63 kg)  11/18/23 135 lb 9.6 oz (61.5 kg)     GEN: Well nourished, well developed in no acute distress CARDIAC: RRR,  no murmurs, rubs, gallops RESPIRATORY:  Normal work of breathing MUSCULOSKELETAL: no edema    ASSESSMENT & PLAN:     Atrial fibrillation --now permanent Had DC cardioversion in May 2020 Atrium is severely enlarged She underwent atrial fibrillation ablation in August 2025 but had recurrence of atrial fibrillation despite being on amiodarone  She reports that she feels reasonably well and is enjoying her quality life presently I think there is little utility in ongoing pursuit  of rhythm control.  Will adjust to a rate control approach. Discontinue amiodarone  She requested that she can transition her care to Dr. Debera in [facility]    Secondary hypercoagulable state Continue Eliquis 5 mg twice daily     Signed, Eulas FORBES Furbish, MD  01/26/2024 4:23 PM    [facility] "

## 2024-01-26 NOTE — Telephone Encounter (Signed)
 Reviewed patients history with Dr. Nancey on 01/25/24 and discussed plan of care moving forward. After discussion, plan was to stop amiodarone  and focus on rate control only / abandon rhythm control efforts.    Patient called and reviewed plan above, TSH findings from our recent visit.  She states she is amenable to plan reviewed and feels it is appropriate.  Discussed rate control moving forward.  She indicates she has an appt with Dr. Nancey this afternoon for follow up.     Daphne Barrack, NP-C, AGACNP-BC [facility] - Electrophysiology  01/26/2024, 12:52 PM

## 2024-01-26 NOTE — Patient Instructions (Signed)
 Medication Instructions:  Your physician has recommended you make the following change in your medication: Stop amiodarone   *If you need a refill on your cardiac medications before your next appointment, please call your pharmacy*  Lab Work: none If you have labs (blood work) drawn today and your tests are completely normal, you will receive your results only by: MyChart Message (if you have MyChart) OR A paper copy in the mail If you have any lab test that is abnormal or we need to change your treatment, we will call you to review the results.  Testing/Procedures: none  Follow-Up: At [facility], you and your health needs are our priority.  As part of our continuing mission to provide you with exceptional heart care, our providers are all part of one team.  This team includes your primary Cardiologist (physician) and Advanced Practice Providers or APPs (Physician Assistants and Nurse Practitioners) who all work together to provide you with the care you need, when you need it.  Your next appointment:   6 month(s)  Provider:   Jayson Sierras, MD    We recommend signing up for the patient portal called MyChart.  Sign up information is provided on this After Visit Summary.  MyChart is used to connect with patients for Virtual Visits (Telemedicine).  Patients are able to view lab/test results, encounter notes, upcoming appointments, etc.  Non-urgent messages can be sent to your provider as well.   To learn more about what you can do with MyChart, go to [email or web address].   Other Instructions  Follow up as needed with Dr Nancey

## 2024-01-26 NOTE — Telephone Encounter (Signed)
 Opened in error

## 2024-03-09 ENCOUNTER — Other Ambulatory Visit (HOSPITAL_COMMUNITY): Payer: Self-pay | Admitting: Internal Medicine
